# Patient Record
Sex: Female | Born: 1970
Health system: Southern US, Community
[De-identification: ages and names within clinical notes are randomized; demographics above are authoritative.]

## PROBLEM LIST (undated history)

## (undated) ENCOUNTER — Ambulatory Visit: Admission: EM | Source: Home / Self Care

## (undated) DIAGNOSIS — Z87442 Personal history of urinary calculi: Secondary | ICD-10-CM

## (undated) DIAGNOSIS — G8929 Other chronic pain: Secondary | ICD-10-CM

## (undated) DIAGNOSIS — F909 Attention-deficit hyperactivity disorder, unspecified type: Secondary | ICD-10-CM

## (undated) DIAGNOSIS — F4310 Post-traumatic stress disorder, unspecified: Secondary | ICD-10-CM

## (undated) DIAGNOSIS — R0600 Dyspnea, unspecified: Secondary | ICD-10-CM

## (undated) DIAGNOSIS — Z9989 Dependence on other enabling machines and devices: Secondary | ICD-10-CM

## (undated) DIAGNOSIS — I10 Essential (primary) hypertension: Secondary | ICD-10-CM

## (undated) DIAGNOSIS — F32A Depression, unspecified: Secondary | ICD-10-CM

## (undated) DIAGNOSIS — J45909 Unspecified asthma, uncomplicated: Secondary | ICD-10-CM

## (undated) DIAGNOSIS — B009 Herpesviral infection, unspecified: Secondary | ICD-10-CM

## (undated) DIAGNOSIS — F419 Anxiety disorder, unspecified: Secondary | ICD-10-CM

## (undated) DIAGNOSIS — R011 Cardiac murmur, unspecified: Secondary | ICD-10-CM

## (undated) DIAGNOSIS — E119 Type 2 diabetes mellitus without complications: Secondary | ICD-10-CM

## (undated) DIAGNOSIS — J449 Chronic obstructive pulmonary disease, unspecified: Secondary | ICD-10-CM

## (undated) DIAGNOSIS — D649 Anemia, unspecified: Secondary | ICD-10-CM

## (undated) HISTORY — PX: CHOLECYSTECTOMY: SHX55

## (undated) HISTORY — PX: APPENDECTOMY: SHX54

## (undated) HISTORY — DX: Attention-deficit hyperactivity disorder, unspecified type: F90.9

## (undated) HISTORY — PX: TUBAL LIGATION: SHX77

## (undated) HISTORY — PX: BACK SURGERY: SHX140

## (undated) HISTORY — PX: COLONOSCOPY: SHX174

## (undated) HISTORY — DX: Depression, unspecified: F32.A

## (undated) HISTORY — DX: Anxiety disorder, unspecified: F41.9

## (undated) HISTORY — PX: OTHER SURGICAL HISTORY: SHX169

---

## 2019-06-27 ENCOUNTER — Emergency Department (HOSPITAL_COMMUNITY): Admission: EM | Admit: 2019-06-27 | Discharge: 2019-06-27 | Discharge status: Absent without leave | Payer: Self-pay

## 2019-06-27 ENCOUNTER — Other Ambulatory Visit: Payer: Self-pay

## 2019-07-02 ENCOUNTER — Other Ambulatory Visit: Payer: Self-pay

## 2019-07-02 ENCOUNTER — Encounter (HOSPITAL_COMMUNITY): Payer: Self-pay

## 2019-07-02 ENCOUNTER — Emergency Department (HOSPITAL_COMMUNITY)
Admission: EM | Admit: 2019-07-02 | Discharge: 2019-07-02 | Disposition: A | Discharge status: Home / Self Care | Payer: Self-pay | Attending: Emergency Medicine | Admitting: Emergency Medicine

## 2019-07-02 DIAGNOSIS — M5441 Lumbago with sciatica, right side: Secondary | ICD-10-CM | POA: Insufficient documentation

## 2019-07-02 DIAGNOSIS — I10 Essential (primary) hypertension: Secondary | ICD-10-CM | POA: Insufficient documentation

## 2019-07-02 DIAGNOSIS — F17210 Nicotine dependence, cigarettes, uncomplicated: Secondary | ICD-10-CM | POA: Insufficient documentation

## 2019-07-02 DIAGNOSIS — G8929 Other chronic pain: Secondary | ICD-10-CM | POA: Insufficient documentation

## 2019-07-02 DIAGNOSIS — Z760 Encounter for issue of repeat prescription: Secondary | ICD-10-CM | POA: Insufficient documentation

## 2019-07-02 HISTORY — DX: Essential (primary) hypertension: I10

## 2019-07-02 MED ORDER — METHYLPREDNISOLONE 4 MG PO TBPK: ORAL_TABLET | ORAL | 0 refills | Status: DC

## 2019-07-02 MED ORDER — LISINOPRIL-HYDROCHLOROTHIAZIDE 20-25 MG PO TABS: 1.0000 | ORAL_TABLET | Freq: Every day | ORAL | 0 refills | Status: DC

## 2019-07-02 MED ORDER — LIDOCAINE 5 % EX PTCH: 1.0000 | MEDICATED_PATCH | CUTANEOUS | 0 refills | Status: DC

## 2019-07-02 MED ORDER — OXYCODONE-ACETAMINOPHEN 5-325 MG PO TABS
1.0000 | ORAL_TABLET | Freq: Once | ORAL | Status: AC
  Administered 2019-07-02: 1 via ORAL
  Filled 2019-07-02: qty 1

## 2019-07-02 MED ORDER — METHOCARBAMOL 500 MG PO TABS: 500.0000 mg | ORAL_TABLET | Freq: Two times a day (BID) | ORAL | 0 refills | Status: DC

## 2019-07-02 MED ORDER — LIDOCAINE 5 % EX PTCH
1.0000 | MEDICATED_PATCH | Freq: Once | CUTANEOUS | Status: DC
  Administered 2019-07-02: 13:00:00 1 via TRANSDERMAL
  Filled 2019-07-02: qty 1

## 2019-07-02 NOTE — ED Provider Notes (Signed)
[facility]-EMERGENCY DEPT Provider Note   CSN: [id number] Arrival date & time: 07/02/19  1056     History   Chief Complaint Chief Complaint  Patient presents with  . Medication Refill    HPI Kelli Juarez is a 48 y.o. female with PMHx HTN who presents to the ED today for acute on chronic right lower back pain that worsened 1 week ago.  She reports she recently moved from [facility] and believes she may have irritated her back from picking up large boxes.  States she has been taking ibuprofen without relief.  Notes that her surgeon in [place] wanted her to follow-up with pain clinic but she was unable to afford it.  She states that she moved here for her husband's job and they are in the process of finding a PCP in the area as well as a pain clinic to follow-up with.  He states that since picking up the heavy boxes she has been having radicular type pain radiating down her right lower extremity from the hip down to the ankle.  History of IV drug use.  No history of chronic steroid use.  No recent spinal manipulation.  Does report back surgery approximately 1 year ago.  Denies fever, chills, saddle anesthesia, urinary retention, urinary or bowel incontinence, weakness, numbness, any other associated symptoms.   Also requesting med refill of her 20-25 lisinopril HCTZ.  She states that she has 2 pills left.  States that she has been compliant with her medication.  Her blood pressure in the ED is 182/119.  She is endorsing this to her back pain.  Denies headache, vision changes, speech difficulties, chest pain, shortness of breath, any other associated symptoms.        Past Medical History:  Diagnosis Date  . Hypertension     There are no active problems to display for this patient.   Past Surgical History:  Procedure Laterality Date  . APPENDECTOMY    . arm surgery    . BACK SURGERY    . CESAREAN SECTION    . CHOLECYSTECTOMY    . gluteal surgery       OB  History   No obstetric history on file.      Home Medications    Prior to Admission medications   Medication Sig Start Date End Date Taking? Authorizing Provider  lidocaine (LIDODERM) 5 % Place 1 patch onto the skin daily. Remove & Discard patch within 12 hours or as directed by MD 07/02/19   Tanda Rockers, PA-C  lisinopril-hydrochlorothiazide (ZESTORETIC) 20-25 MG tablet Take 1 tablet by mouth daily. 07/02/19 08/01/19  Tanda Rockers, PA-C  methylPREDNISolone (MEDROL DOSEPAK) 4 MG TBPK tablet Follow insert on back of package 07/02/19   Tanda Rockers, PA-C    Family History History reviewed. No pertinent family history.  Social History Social History   Tobacco Use  . Smoking status: Current Every Day Smoker    Packs/day: 0.35    Types: Cigarettes  . Smokeless tobacco: Never Used  Substance Use Topics  . Alcohol use: Never    Frequency: Never  . Drug use: Never     Allergies   Toradol [ketorolac tromethamine]   Review of Systems Review of Systems  Constitutional: Negative for chills and fever.  HENT: Negative for congestion.   Eyes: Negative for visual disturbance.  Respiratory: Negative for cough and shortness of breath.   Cardiovascular: Negative for chest pain.  Gastrointestinal: Negative for abdominal pain, nausea and vomiting.  Genitourinary: Negative for difficulty urinating.  Musculoskeletal: Positive for arthralgias and back pain.  Skin: Negative for rash.  Neurological: Negative for weakness, numbness and headaches.     Physical Exam Updated Vital Signs BP (!) 182/119 (BP Location: Left Arm)   Pulse 68   Temp 98.9 F (37.2 C) (Oral)   Resp 18   Ht 5\' 1"  (1.549 m)   Wt 74.8 kg   SpO2 98%   BMI 31.18 kg/m   Physical Exam Vitals signs and nursing note reviewed.  Constitutional:      Appearance: She is not ill-appearing.  HENT:     Head: Normocephalic and atraumatic.  Eyes:     Extraocular Movements: Extraocular movements intact.      Conjunctiva/sclera: Conjunctivae normal.     Pupils: Pupils are equal, round, and reactive to light.  Neck:     Musculoskeletal: Neck supple.  Cardiovascular:     Rate and Rhythm: Normal rate and regular rhythm.     Pulses: Normal pulses.  Pulmonary:     Effort: Pulmonary effort is normal.     Breath sounds: Normal breath sounds. No wheezing, rhonchi or rales.  Abdominal:     Palpations: Abdomen is soft.     Tenderness: There is no abdominal tenderness. There is no guarding or rebound.  Musculoskeletal:     Comments: Well healed midline incision to lumbar spine. No C, T, or L midline spinal tenderness. + Right lumbar paraspinal TTP radiating into buttocks and right hip. Strength 4/5 to hip flexion, hip extension, knee flexion, knee extension, dorsiflexion, and plantarflexion on right. 5/5 on left. Sensation intact throughout to dull and sharp touch. + SLR on right. 2+DP and PT pulses.   Skin:    General: Skin is warm and dry.  Neurological:     Mental Status: She is alert.      ED Treatments / Results  Labs (all labs ordered are listed, but only abnormal results are displayed) Labs Reviewed - No data to display  EKG None  Radiology No results found.  Procedures Procedures (including critical care time)  Medications Ordered in ED Medications  lidocaine (LIDODERM) 5 % 1 patch (1 patch Transdermal Patch Applied 07/02/19 1323)  oxyCODONE-acetaminophen (PERCOCET/ROXICET) 5-325 MG per tablet 1 tablet (1 tablet Oral Given 07/02/19 1323)     Initial Impression / Assessment and Plan / ED Course  I have reviewed the triage vital signs and the nursing notes.  Pertinent labs & imaging results that were available during my care of the patient were reviewed by me and considered in my medical decision making (see chart for details).  Clinical Course as of Jul 01 1348  Tue Jul 02, 2019  1336 BP(!): 169/100 [MV]    Clinical Course User Index [MV] Eustaquio Maize, [place]    48 year old female who presents the ED requesting med refill of her 20 25 lisinopril HCTZ. Moved to the area from [place] and states she only has 2 pills left.  Is currently trying to get in the process of finding a PCP.  Pressure in the ED is 182/119.  Patient attributes this to her worsening acute on chronic back pain which was aggravated during moving large boxes and furniture last week.  She now has radicular type pain radiating down her right lower extremity which she states is new.  No red flags today concerning for cauda equina or epidural abscess.  She is afebrile in the ED without tachycardia or tachypnea.  No midline spinal tenderness.  Do not feel she needs emergent imaging at this time.    Given she states she has been compliant with her blood pressure medication they could truly be elevated due to pain.  I have given a one-time dose of Percocet in the ED as well as applied a lidocaine patch.  I had lengthy discussion with patient that I will not be prescribing narcotic pain medicine for her chronic back pain today.  Will check blood pressure after pain medication to ensure it is coming down.  I will be more than happy to write her a months worth of her blood pressure medication and give her resources for outpatient follow-up.  Feel she will benefit from steroids for her back pain.   Blood Pressure improved after pain medication.  Most recent reading 169/100.  Feel patient is stable for discharge at this time.  I have prescribed a months worth of her blood pressure medicine.  Return precautions have been discussed with patient including urinary or bowel incontinence, saddle anesthesia, urinary retention, worsening pain.  He is in agreement with plan at this time stable for discharge home.   This note was prepared using Dragon voice recognition software and may include unintentional dictation errors due to the inherent limitations of voice recognition software.       Final Clinical  Impressions(s) / ED Diagnoses   Final diagnoses:  Medication refill  Essential hypertension  Acute right-sided low back pain with right-sided sciatica    ED Discharge Orders         Ordered    lisinopril-hydrochlorothiazide (ZESTORETIC) 20-25 MG tablet  Daily     07/02/19 1346    lidocaine (LIDODERM) 5 %  Every 24 hours     07/02/19 1346    methylPREDNISolone (MEDROL DOSEPAK) 4 MG TBPK tablet     07/02/19 1346           Tanda RockersVenter, Takina Busser, PA-C 07/02/19 1349    Milagros Lollykstra, Richard S, MD 07/03/19 [phone]

## 2019-07-02 NOTE — ED Triage Notes (Signed)
Patient states she recently moved from [place] an dis now having right lower back pain that radiates into the right leg.  Patient is requesting a medication refill of Lisinopril.

## 2019-07-02 NOTE — Discharge Instructions (Addendum)
Please take medication as prescribed for your back pain I have also prescribed a months worth of your blood pressure medication  Please find a PCP to establish care - attached is a clinic that you may try to follow up with as well

## 2019-07-14 ENCOUNTER — Encounter (HOSPITAL_COMMUNITY): Payer: Self-pay | Admitting: Emergency Medicine

## 2019-07-14 ENCOUNTER — Other Ambulatory Visit: Payer: Self-pay

## 2019-07-14 ENCOUNTER — Emergency Department (HOSPITAL_COMMUNITY)
Admission: EM | Admit: 2019-07-14 | Discharge: 2019-07-14 | Disposition: A | Discharge status: Home / Self Care | Payer: Self-pay | Attending: Emergency Medicine | Admitting: Emergency Medicine

## 2019-07-14 ENCOUNTER — Emergency Department (HOSPITAL_COMMUNITY): Payer: Self-pay

## 2019-07-14 DIAGNOSIS — I10 Essential (primary) hypertension: Secondary | ICD-10-CM | POA: Insufficient documentation

## 2019-07-14 DIAGNOSIS — M25561 Pain in right knee: Secondary | ICD-10-CM | POA: Insufficient documentation

## 2019-07-14 DIAGNOSIS — M545 Low back pain, unspecified: Secondary | ICD-10-CM

## 2019-07-14 DIAGNOSIS — G8929 Other chronic pain: Secondary | ICD-10-CM | POA: Insufficient documentation

## 2019-07-14 DIAGNOSIS — F17210 Nicotine dependence, cigarettes, uncomplicated: Secondary | ICD-10-CM | POA: Insufficient documentation

## 2019-07-14 HISTORY — DX: Other chronic pain: G89.29

## 2019-07-14 MED ORDER — OXYCODONE-ACETAMINOPHEN 5-325 MG PO TABS
1.0000 | ORAL_TABLET | Freq: Once | ORAL | Status: AC
  Administered 2019-07-14: 17:00:00 1 via ORAL
  Filled 2019-07-14: qty 1

## 2019-07-14 MED ORDER — CYCLOBENZAPRINE HCL 10 MG PO TABS: 10.0000 mg | ORAL_TABLET | Freq: Two times a day (BID) | ORAL | 0 refills | Status: DC | PRN

## 2019-07-14 MED ORDER — LIDOCAINE 5 % EX PTCH: 1.0000 | MEDICATED_PATCH | CUTANEOUS | 0 refills | Status: DC

## 2019-07-14 MED ORDER — OXYCODONE HCL 5 MG PO TABS
5.0000 mg | ORAL_TABLET | Freq: Once | ORAL | Status: AC
  Administered 2019-07-14: 5 mg via ORAL
  Filled 2019-07-14: qty 1

## 2019-07-14 NOTE — ED Notes (Signed)
Patient reports she takes 10-325 Percocet's at home. RN made PA aware. Patient unsatisfied with pain management.

## 2019-07-14 NOTE — ED Provider Notes (Signed)
[facility]-EMERGENCY DEPT Provider Note   CSN: [id number] Arrival date & time: 07/14/19  1310     History   Chief Complaint Chief Complaint  Patient presents with  . Back Pain    HPI Kelli BarbanConnie Waxman is a 48 y.o. female with a past medical history of chronic back pain, hypertension presenting to the ED with a chief complaint of back pain.  Reports aching lower back pain radiating down her right leg.  Seen and evaluated on 07/02/2019 and was given steroids, muscle relaxer.  States that she has been taking these but they have not helped her.  [place] moved to [place] area [place] 2 [place] ago.  She believes that the back pain was set off by falling onto her right knee 2 months ago before she moved.  Reports she has had prior back surgeries.  She has tried Percocet in the past which helped her pain.  She denies any additional injuries or falls.  Reports not establishing care with a primary care provider or pain medicine clinic here.  Denies any fevers, shortness of breath, chest pain, vomiting, numbness in arms or legs.     HPI  Past Medical History:  Diagnosis Date  . Chronic back pain   . Hypertension     There are no active problems to display for this patient.   Past Surgical History:  Procedure Laterality Date  . APPENDECTOMY    . arm surgery    . BACK SURGERY    . CESAREAN SECTION    . CHOLECYSTECTOMY    . gluteal surgery       OB History   No obstetric history on file.      Home Medications    Prior to Admission medications   Medication Sig Start Date End Date Taking? Authorizing Provider  cyclobenzaprine (FLEXERIL) 10 MG tablet Take 1 tablet (10 mg total) by mouth 2 (two) times daily as needed for muscle spasms. 07/14/19   Asah Lamay, PA-C  lidocaine (LIDODERM) 5 % Place 1 patch onto the skin daily. Remove & Discard patch within 12 hours or as directed by MD 07/14/19   Dietrich PatesKhatri, Trianna Lupien, PA-C  lisinopril-hydrochlorothiazide (ZESTORETIC) 20-25 MG  tablet Take 1 tablet by mouth daily. 07/02/19 08/01/19  Tanda RockersVenter, Margaux, PA-C  methocarbamol (ROBAXIN) 500 MG tablet Take 1 tablet (500 mg total) by mouth 2 (two) times daily. 07/02/19   Tanda RockersVenter, Margaux, PA-C  methylPREDNISolone (MEDROL DOSEPAK) 4 MG TBPK tablet Follow insert on back of package 07/02/19   Tanda RockersVenter, Margaux, PA-C    Family History No family history on file.  Social History Social History   Tobacco Use  . Smoking status: Current Every Day Smoker    Packs/day: 0.35    Types: Cigarettes  . Smokeless tobacco: Never Used  Substance Use Topics  . Alcohol use: Never    Frequency: Never  . Drug use: Never     Allergies   Penicillins and Toradol [ketorolac tromethamine]   Review of Systems Review of Systems  Constitutional: Negative for chills and fever.  Respiratory: Negative for shortness of breath.   Cardiovascular: Negative for chest pain.  Musculoskeletal: Positive for arthralgias, back pain and myalgias.  Neurological: Negative for weakness and numbness.     Physical Exam Updated Vital Signs BP (!) 149/99 (BP Location: Right Arm)   Pulse 79   Temp 98.4 F (36.9 C)   Resp 17   SpO2 100%   Physical Exam Vitals signs and nursing note reviewed.  Constitutional:      General: She is not in acute distress.    Appearance: She is well-developed. She is not diaphoretic.  HENT:     Head: Normocephalic and atraumatic.  Eyes:     General: No scleral icterus.    Conjunctiva/sclera: Conjunctivae normal.  Neck:     Musculoskeletal: Normal range of motion.  Cardiovascular:     Rate and Rhythm: Normal rate and regular rhythm.     Heart sounds: Normal heart sounds.  Pulmonary:     Effort: Pulmonary effort is normal. No respiratory distress.     Breath sounds: Normal breath sounds.  Musculoskeletal:       Back:     Comments: No midline spinal tenderness present in lumbar, thoracic or cervical spine. No step-off palpated. No visible bruising, edema or temperature  change noted. No objective signs of numbness present. No saddle anesthesia. 2+ DP pulses bilaterally. Sensation intact to light touch. Strength 5/5 in bilateral lower extremities.  Skin:    Findings: No rash.  Neurological:     Mental Status: She is alert.      ED Treatments / Results  Labs (all labs ordered are listed, but only abnormal results are displayed) Labs Reviewed - No data to display  EKG None  Radiology Dg Lumbar Spine Complete  Result Date: 07/14/2019 CLINICAL DATA:  Low back pain. EXAM: LUMBAR SPINE - COMPLETE 4+ VIEW COMPARISON:  None. FINDINGS: Right-sided pedicle screws are seen at L4 and L5. A disc spacer device is seen at the same level. No malalignment. No fracture. IMPRESSION: Surgical hardware at L4 and L5.  No other abnormalities. Electronically Signed   By: Dorise Bullion III M.D   On: 07/14/2019 16:42   Dg Knee Complete 4 Views Right  Result Date: 07/14/2019 CLINICAL DATA:  Pain. EXAM: RIGHT KNEE - COMPLETE 4+ VIEW COMPARISON:  None. FINDINGS: No evidence of fracture, dislocation, or joint effusion. No evidence of arthropathy or other focal bone abnormality. Soft tissues are unremarkable. IMPRESSION: Negative. Electronically Signed   By: Dorise Bullion III M.D   On: 07/14/2019 16:43    Procedures Procedures (including critical care time)  Medications Ordered in ED Medications  oxyCODONE-acetaminophen (PERCOCET/ROXICET) 5-325 MG per tablet 1 tablet (1 tablet Oral Given 07/14/19 1630)  oxyCODONE (Oxy IR/ROXICODONE) immediate release tablet 5 mg (5 mg Oral Given 07/14/19 1748)     Initial Impression / Assessment and Plan / ED Course  I have reviewed the triage vital signs and the nursing notes.  Pertinent labs & imaging results that were available during my care of the patient were reviewed by me and considered in my medical decision making (see chart for details).        48 year old female presenting to the ED with a chief complaint of flareup  of chronic back pain.  This is her second visit in the past 2 weeks.  She moved from [place] 2 months ago and has not yet established care with a PCP or pain management clinic.  She is unsatisfied with her pain control at her previous visit, states that "they never even give me a prescription for lidocaine patches, the muscle relaxers do not even work I do not want to take steroids."  She states that she takes Percocet tens at home.  Patient and her husband adamant that they need some type of "strong pain control" at home.  Has been complains that he is unable to sleep at night due to the patient's pain.  Explained  after speaking with Dr. Rush Landmark that our policy prevents [place] from prescribing short-term or long-term narcotics for chronic pain.  Despite my lengthy discussion with them, they are requesting to speak to Dr. Rush Landmark in person. Dr. Rush Landmark spoke to the patient and husband and reiterated our policies.  Will discharge home with Lidoderm patch and Flexeril.  Patient encouraged to follow-up with PCP, pain management and spine specialist.  Patient is hemodynamically stable, in NAD, and able to ambulate in the ED. Evaluation does not show pathology that would require ongoing emergent intervention or inpatient treatment. I explained the diagnosis to the patient. Pain has been managed and has no complaints prior to discharge. Patient is comfortable with above plan and is stable for discharge at this time. All questions were answered prior to disposition. Strict return precautions for returning to the ED were discussed. Encouraged follow up with PCP.   An After Visit Summary was printed and given to the patient.   Portions of this note were generated with Scientist, clinical (histocompatibility and immunogenetics). Dictation errors may occur despite best attempts at proofreading.   Final Clinical Impressions(s) / ED Diagnoses   Final diagnoses:  Chronic bilateral low back pain without sciatica    ED Discharge Orders         Ordered     lidocaine (LIDODERM) 5 %  Every 24 hours     07/14/19 1725    cyclobenzaprine (FLEXERIL) 10 MG tablet  2 times daily PRN     07/14/19 1738           Dietrich Pates, PA-C 07/14/19 1813    Tegeler, Canary Brim, MD 07/14/19 1818

## 2019-07-14 NOTE — ED Triage Notes (Signed)
Per pt, states lower back pain radiating down right leg-states she has not established care with PCP since relocating

## 2019-07-14 NOTE — Discharge Instructions (Signed)
Use the lidocaine patch as prescribed. Follow up with the primary care provider listed below to establish care.  Have also provided follow-up information for spine specialist. Return to the ED if you start to develop chest pain, shortness of breath, slurred speech, injuries or falls.

## 2019-07-15 ENCOUNTER — Telehealth: Payer: Self-pay | Admitting: *Deleted

## 2019-07-15 NOTE — Telephone Encounter (Signed)
TOC CM received referral to assist with PCP and medications. Pt states she recently moved to [facility] and is currently without PCP and insurance. Appt arranged at [facility] for 08/01/2019 at 8:50 am, the flexeril is $8 at [facility] and Lidocaine patches $62 with goodrx coupon. Pt states these meds really do not help with pain. [place], [place] ED TOC CM [phone]

## 2019-08-01 ENCOUNTER — Encounter (INDEPENDENT_AMBULATORY_CARE_PROVIDER_SITE_OTHER): Payer: Self-pay | Admitting: Primary Care

## 2019-08-01 ENCOUNTER — Ambulatory Visit (INDEPENDENT_AMBULATORY_CARE_PROVIDER_SITE_OTHER): Payer: Self-pay | Admitting: Primary Care

## 2019-08-01 ENCOUNTER — Other Ambulatory Visit: Payer: Self-pay

## 2019-08-01 ENCOUNTER — Encounter (HOSPITAL_COMMUNITY): Payer: Self-pay | Admitting: Emergency Medicine

## 2019-08-01 ENCOUNTER — Emergency Department (HOSPITAL_COMMUNITY)
Admission: EM | Admit: 2019-08-01 | Discharge: 2019-08-01 | Disposition: A | Discharge status: Home / Self Care | Payer: Self-pay | Attending: Emergency Medicine | Admitting: Emergency Medicine

## 2019-08-01 VITALS — BP 132/93 | HR 83 | Temp 98.2°F | Ht 61.0 in | Wt 179.8 lb

## 2019-08-01 DIAGNOSIS — F17210 Nicotine dependence, cigarettes, uncomplicated: Secondary | ICD-10-CM | POA: Insufficient documentation

## 2019-08-01 DIAGNOSIS — G8929 Other chronic pain: Secondary | ICD-10-CM

## 2019-08-01 DIAGNOSIS — M545 Low back pain: Secondary | ICD-10-CM | POA: Insufficient documentation

## 2019-08-01 DIAGNOSIS — Z720 Tobacco use: Secondary | ICD-10-CM

## 2019-08-01 DIAGNOSIS — Z09 Encounter for follow-up examination after completed treatment for conditions other than malignant neoplasm: Secondary | ICD-10-CM

## 2019-08-01 DIAGNOSIS — Z7689 Persons encountering health services in other specified circumstances: Secondary | ICD-10-CM

## 2019-08-01 DIAGNOSIS — I10 Essential (primary) hypertension: Secondary | ICD-10-CM | POA: Insufficient documentation

## 2019-08-01 MED ORDER — LISINOPRIL-HYDROCHLOROTHIAZIDE 20-25 MG PO TABS: 1.0000 | ORAL_TABLET | Freq: Every day | ORAL | 3 refills | Status: DC

## 2019-08-01 MED ORDER — OXYCODONE-ACETAMINOPHEN 7.5-325 MG PO TABS
1.0000 | ORAL_TABLET | Freq: Once | ORAL | Status: AC
  Administered 2019-08-01: 12:00:00 1 via ORAL
  Filled 2019-08-01: qty 1

## 2019-08-01 MED ORDER — CYCLOBENZAPRINE HCL 10 MG PO TABS: 10.0000 mg | ORAL_TABLET | Freq: Two times a day (BID) | ORAL | 3 refills | Status: DC | PRN

## 2019-08-01 MED FILL — CYCLOBENZAPRINE 10 MG TAB: 10 | 30 days supply | Qty: 60 | Fill #0

## 2019-08-01 MED FILL — LISINOPRIL-HCTZ 20-25 MG TA: 20-25 | 30 days supply | Qty: 30 | Fill #0

## 2019-08-01 NOTE — Progress Notes (Signed)
New Patient Office Visit  Subjective:  Patient ID: Kelli Juarez, female    DOB: 10-16-1970  Age: 48 y.o. MRN: [id number]  CC:  Chief Complaint  Patient presents with  . Hospitalization Follow-up    chronic back pain without sciatica    HPI Kelli Juarez presents for is establishing care , she has had 2 back surgeries and is in chronic pain all the time taking excessive amounts of ibuprofen 200mg  (10) at the time. Explain damage to kidneys. She presenting to the emergency room  with a chief complaint of flareup of chronic back pain.  This is was her second visit in the past 2 weeks.  She previously moved from [place]  approxmately 2 months ago. She voiced unsatisfied with her pain control at her previous visit, states that "they never even give me a prescription for lidocaine patches, the muscle relaxers do not even work I do not want to take steroids."  Percocet is the only medication that help. Explained we do not prescribe narcotics.   Past Medical History:  Diagnosis Date  . Chronic back pain   . Hypertension     Past Surgical History:  Procedure Laterality Date  . APPENDECTOMY    . arm surgery    . BACK SURGERY    . CESAREAN SECTION    . CHOLECYSTECTOMY    . gluteal surgery      No family history on file.  Social History   Socioeconomic History  . Marital status: Married    Spouse name: Not on file  . Number of children: Not on file  . Years of education: Not on file  . Highest education level: Not on file  Occupational History  . Not on file  Social Needs  . Financial resource strain: Not on file  . Food insecurity    Worry: Not on file    Inability: Not on file  . Transportation needs    Medical: Not on file    Non-medical: Not on file  Tobacco Use  . Smoking status: Current Every Day Smoker    Packs/day: 0.35    Types: Cigarettes  . Smokeless tobacco: Never Used  Substance and Sexual Activity  . Alcohol use: Never    Frequency: Never  . Drug use:  Never  . Sexual activity: Not on file  Lifestyle  . Physical activity    Days per week: Not on file    Minutes per session: Not on file  . Stress: Not on file  Relationships  . Social Herbalist on phone: Not on file    Gets together: Not on file    Attends religious service: Not on file    Active member of club or organization: Not on file    Attends meetings of clubs or organizations: Not on file    Relationship status: Not on file  . Intimate partner violence    Fear of current or ex partner: Not on file    Emotionally abused: Not on file    Physically abused: Not on file    Forced sexual activity: Not on file  Other Topics Concern  . Not on file  Social History Narrative  . Not on file    ROS Review of Systems  Musculoskeletal: Positive for back pain.       Right side  All other systems reviewed and are negative.   Objective:   Today's Vitals: BP (!) 132/93 (BP Location: Left Arm, Patient Position: Sitting,  Cuff Size: Normal)   Pulse 83   Temp 98.2 F (36.8 C) (Oral)   Ht 5\' 1"  (1.549 m)   Wt 179 lb 12.8 oz (81.6 kg)   SpO2 94%   BMI 33.97 kg/m   Physical Exam Vitals signs reviewed.  Constitutional:      Appearance: Normal appearance.  HENT:     Right Ear: Tympanic membrane normal.     Left Ear: Tympanic membrane normal.  Eyes:     Extraocular Movements: Extraocular movements intact.     Pupils: Pupils are equal, round, and reactive to light.  Cardiovascular:     Pulses: Normal pulses.  Pulmonary:     Effort: Pulmonary effort is normal.     Breath sounds: Normal breath sounds.  Abdominal:     General: Abdomen is flat. Bowel sounds are normal.     Palpations: Abdomen is soft.  Skin:    General: Skin is warm and dry.  Neurological:     Mental Status: She is oriented to person, place, and time.  Psychiatric:     Comments: Crying thought the visit due to pain in her back     Assessment & Plan:  Bryleigh was seen today for  hospitalization follow-up.  Diagnoses and all orders for this visit:  Encounter to establish care Recently moved from [place] 2 back surgery in [place] imaging confirms with hardware. [place], NP-C will be your  (PCP) treat diagnosis and undiagnosed health concern as well as continuing care of varied medical conditions, not limited by cause, organ system, or diagnosis.   Chronic bilateral low back pain without sciatica -     cyclobenzaprine (FLEXERIL) 10 MG tablet; Take 1 tablet (10 mg total) by mouth 2 (two) times daily as needed for muscle spasms.  Essential hypertension Counseled on blood pressure goal of less than 130/80, low-sodium,  Unable to exercise due to back pain. Discussed medication compliance, adverse effects. Refilled  -     lisinopril-hydrochlorothiazide (ZESTORETIC) 20-25 MG tablet; Take 1 tablet by mouth daily.  Tobacco abuse Patient is aware of risk for increase respiratory infections and lung cancer . Each visit will discuss cessation.     Follow-up: No follow-ups on file.   Kelli Passe, NP

## 2019-08-01 NOTE — ED Provider Notes (Signed)
[facility] EMERGENCY DEPARTMENT Provider Note   CSN: [id number] Arrival date & time: 08/01/19  6546     History   Chief Complaint Chief Complaint  Patient presents with  . Back Pain    HPI Kelli Juarez is a 48 y.o. female with a history of tobacco abuse, chronic back pain, and hypertension who returns to the ER for continued back pain. Patient has history of chronic back pain with prior surgical intervention.  She is new to the area and has not established care with a primary care provider, neurosurgeon, or pain management specialist.  She has previously been prescribed Norco, oxycodone, and tramadol each of these had helped her pain but she no longer has them.  She has tried gabapentin, steroid packs, and multiple muscle relaxers without much relief.  She has also tried over-the-counter NSAIDs.  She states that she finally has money to able to go see a specialist, she is requesting referrals.  States pain is located to her lower back sometimes radiates down the right lower extremity but without numbness/tingling/weakness. No acute change in her chronic pain. NO recent trauma. Denies numbness, tingling, weakness, saddle anesthesia, incontinence to bowel/bladder, fever, chills, IV drug use, dysuria, or hx of cancer.       HPI  Past Medical History:  Diagnosis Date  . Chronic back pain   . Hypertension     There are no active problems to display for this patient.   Past Surgical History:  Procedure Laterality Date  . APPENDECTOMY    . arm surgery    . BACK SURGERY    . CESAREAN SECTION    . CHOLECYSTECTOMY    . gluteal surgery       OB History   No obstetric history on file.      Home Medications    Prior to Admission medications   Medication Sig Start Date End Date Taking? Authorizing Provider  cyclobenzaprine (FLEXERIL) 10 MG tablet Take 1 tablet (10 mg total) by mouth 2 (two) times daily as needed for muscle spasms. 08/01/19   Grayce Sessions, NP  lisinopril-hydrochlorothiazide (ZESTORETIC) 20-25 MG tablet Take 1 tablet by mouth daily. 08/01/19 08/31/19  Grayce Sessions, NP    Family History History reviewed. No pertinent family history.  Social History Social History   Tobacco Use  . Smoking status: Current Every Day Smoker    Packs/day: 0.35    Types: Cigarettes  . Smokeless tobacco: Never Used  Substance Use Topics  . Alcohol use: Never    Frequency: Never  . Drug use: Never     Allergies   Penicillins and Toradol [ketorolac tromethamine]   Review of Systems Review of Systems  Constitutional: Negative for chills and fever.  Respiratory: Negative for shortness of breath.   Cardiovascular: Negative for chest pain.  Gastrointestinal: Negative for abdominal pain.  Genitourinary: Negative for dysuria and hematuria.  Musculoskeletal: Positive for back pain.  Neurological: Negative for weakness and numbness.       Negative for incontinence or saddle anesthesia.      Physical Exam Updated Vital Signs BP 136/85 (BP Location: Right Arm)   Pulse 75   Temp 98.3 F (36.8 C) (Oral)   Resp 16   SpO2 98%   Physical Exam Constitutional:      General: She is not in acute distress.    Appearance: She is well-developed. She is not toxic-appearing.  HENT:     Head: Normocephalic and atraumatic.  Neck:  Musculoskeletal: Normal range of motion and neck supple. No spinous process tenderness or muscular tenderness.  Musculoskeletal:     Comments: No obvious deformity, appreciable swelling, erythema, ecchymosis, significant open wounds, or increased warmth.  Extremities: Normal ROM. Nontender.  Back: Patient has tenderness to the L spine region w/ R paraspinal muscle tenderness & gluteal tenderness. No point/focal vertebral tenderness, no palpable step off or crepitus.  Skin:    General: Skin is warm and dry.     Findings: No rash.  Neurological:     Mental Status: She is alert.     Deep Tendon Reflexes:      Reflex Scores:      Patellar reflexes are 2+ on the right side and 2+ on the left side.    Comments: Sensation grossly intact to bilateral lower extremities. 5/5 symmetric strength with plantar/dorsiflexion bilaterally. Antalgic gait.     ED Treatments / Results  Labs (all labs ordered are listed, but only abnormal results are displayed) Labs Reviewed - No data to display  EKG None  Radiology No results found.  Procedures Procedures (including critical care time)  Medications Ordered in ED Medications  oxyCODONE-acetaminophen (PERCOCET) 7.5-325 MG per tablet 1 tablet (1 tablet Oral Given 08/01/19 1209)     Initial Impression / Assessment and Plan / ED Course  I have reviewed the triage vital signs and the nursing notes.  Pertinent labs & imaging results that were available during my care of the patient were reviewed by me and considered in my medical decision making (see chart for details).   Patient returns to the emergency department for ongoing chronic lower back pain.  She is nontoxic-appearing, resting comfortably on initial assessment, vitals WNL.  Exam with lumbar tenderness to the midline and right lumbar paraspinal muscles extending to the gluteal area.  No point/focal vertebral tenderness.  No palpable step-off.  Patient had recent L-spine x-ray that did not show any acute abnormalities, no recent change in pain or injury to necessitate repeat imaging.  She has no neuro deficits to indicate cord compression or cauda equina syndrome.  No fever or IVDU to raise concern for epidural abscess.  Will give one-time dose of oxycodone in the emergency department.  Reeducated patient and her husband regarding chronic pain and narcotic prescriptions and lack of appropriateness in the emergency department setting.  Offered additional muscle relaxants, steroids, topical gels, or topical patches which patient declined.  She will be given information for pain management, neurosurgery, as  well as an ambulatory referral to rehab. I discussed treatment plan, need for follow-up, and return precautions with the patient. Provided opportunity for questions, patient confirmed understanding and is in agreement with plan.    Final Clinical Impressions(s) / ED Diagnoses   Final diagnoses:  Chronic low back pain, unspecified back pain laterality, unspecified whether sciatica present    ED Discharge Orders         Ordered    Ambulatory referral to Physical Medicine Rehab     08/01/19 Perry Hall, [place], PA-C 08/01/19 1211    Carmin Muskrat, MD 08/01/19 1354

## 2019-08-01 NOTE — Patient Instructions (Signed)

## 2019-08-01 NOTE — ED Triage Notes (Signed)
Pt arrives to ED from home with complaints of acute on chronic lower back pain that continues to get worse throughout this week. Patient has diagnosed pinched nerves and has had two surgeries in the past for this. Patient also has arthritis in their spine. Patient states that she has been taking cyclobenzaprine and methocarbamol at home for this. Patient states she needs for medicine for her pain control.

## 2019-08-01 NOTE — Discharge Instructions (Addendum)
You were seen in the emergency department today for back pain.  You were given oxycodone in the ER.  Please continue your muscle relaxers at home.  Please try applying a heating pad to the lower back.  We would like you to follow-up with a pain management specialist, a neuro spine specialist, as well as physical medicine rehab (similar to physical therapy).   Please call you to the specialist today to make an appointment soon as possible.  Please also call the so number circled to help establish primary care in the area.  Return to the ER for new or worsening symptoms including but not limited to numbness, weakness, loss of control of bowel/bladder function, fever, or any other concerns.

## 2019-08-06 ENCOUNTER — Encounter: Payer: Self-pay | Attending: Physical Medicine and Rehabilitation | Admitting: Physical Medicine and Rehabilitation

## 2019-08-06 ENCOUNTER — Encounter: Payer: Self-pay | Admitting: Physical Medicine and Rehabilitation

## 2019-08-06 ENCOUNTER — Other Ambulatory Visit: Payer: Self-pay

## 2019-08-06 VITALS — BP 130/91 | HR 76 | Temp 97.9°F | Ht 61.0 in | Wt 180.0 lb

## 2019-08-06 DIAGNOSIS — G8929 Other chronic pain: Secondary | ICD-10-CM | POA: Insufficient documentation

## 2019-08-06 DIAGNOSIS — M5441 Lumbago with sciatica, right side: Secondary | ICD-10-CM | POA: Insufficient documentation

## 2019-08-06 NOTE — Progress Notes (Signed)
Subjective:    Patient ID: Kelli Juarez, female    DOB: 25-Jan-1971, 48 y.o.   MRN: [id number]  HPI Kelli Juarez Is a 48 year old woman who has had prior surgeries at L4-L5 in 2014 and 2015. These were done in [place]. After her first surgery she had PT, but she did not get to follow-up with him after the second surgery because her physician switched practices. In the last 5 years she feels she has tried everything. Now her husband is working here and she will be getting insurance soon.  She has tried injections, pain medication.  She wants to be able to exercise again. She is 180 and 5 foot 1. She wants to garden. She can't stand or sit for long period of time. If she sits on the bed for long periods of time she feels a shooting pain down her right leg down to below her knee cap. She has tried gabapentin, Cymbalta, lidocaine patches, steroids and has never had relief.   Reviewed Xray of lumbar spine which shows "Surgical hardware at L4 and L5.  No other abnormalities."  Pain Inventory Average Pain 9 Pain Right Now 10 My pain is sharp, burning, dull, stabbing, tingling and aching  In the last 24 hours, has pain interfered with the following? General activity 2 Relation with others 0 Enjoyment of life 3 What TIME of day is your pain at its worst? all Sleep (in general) Poor  Pain is worse with: walking, bending, sitting, inactivity and standing Pain improves with: medication Relief from Meds: 0  Mobility walk with assistance use a cane how many minutes can you walk? 10 ability to climb steps?  no do you drive?  no Do you have any goals in this area?  yes  Function not employed: date last employed . I need assistance with the following:  household duties and shopping  Neuro/Psych weakness numbness tingling depression anxiety  Prior Studies new  Physicians involved in your care new   History reviewed. No pertinent family history. Social History    Socioeconomic History  . Marital status: Married    Spouse name: Not on file  . Number of children: Not on file  . Years of education: Not on file  . Highest education level: Not on file  Occupational History  . Not on file  Social Needs  . Financial resource strain: Not on file  . Food insecurity    Worry: Not on file    Inability: Not on file  . Transportation needs    Medical: Not on file    Non-medical: Not on file  Tobacco Use  . Smoking status: Current Every Day Smoker    Packs/day: 0.35    Types: Cigarettes  . Smokeless tobacco: Never Used  Substance and Sexual Activity  . Alcohol use: Never    Frequency: Never  . Drug use: Never  . Sexual activity: Not on file  Lifestyle  . Physical activity    Days per week: Not on file    Minutes per session: Not on file  . Stress: Not on file  Relationships  . Social Musician on phone: Not on file    Gets together: Not on file    Attends religious service: Not on file    Active member of club or organization: Not on file    Attends meetings of clubs or organizations: Not on file    Relationship status: Not on file  Other Topics  Concern  . Not on file  Social History Narrative  . Not on file   Past Surgical History:  Procedure Laterality Date  . APPENDECTOMY    . arm surgery    . BACK SURGERY    . CESAREAN SECTION    . CHOLECYSTECTOMY    . gluteal surgery     Past Medical History:  Diagnosis Date  . Chronic back pain   . Hypertension    Temp 97.9 F (36.6 C)   Ht 5\' 1"  (1.549 m)   Wt 180 lb (81.6 kg)   BMI 34.01 kg/m   Opioid Risk Score:   Fall Risk Score:  `1  Depression screen PHQ 2/9  Depression screen [facility] 2/9 08/06/2019 08/01/2019  Decreased Interest 1 1  Down, Depressed, Hopeless 1 1  PHQ - 2 Score 2 2  Altered sleeping 3 3  Tired, decreased energy 3 3  Change in appetite 3 2  Feeling bad or failure about yourself  1 1  Trouble concentrating 1 1  Moving slowly or  fidgety/restless 3 0  Suicidal thoughts 0 0  PHQ-9 Score 16 12    Review of Systems  Constitutional: Positive for unexpected weight change.  HENT: Negative.   Eyes: Negative.   Respiratory: Negative.   Cardiovascular: Negative.   Gastrointestinal: Negative.   Genitourinary: Negative.   Musculoskeletal: Positive for arthralgias, back pain, gait problem, neck pain and neck stiffness.  Skin: Negative.   Allergic/Immunologic: Negative.   Neurological: Positive for weakness and numbness.       Tingling   Psychiatric/Behavioral: Positive for dysphoric mood. The patient is nervous/anxious.   All other systems reviewed and are negative.      Objective:   Physical Exam Gen: Tearful, frustrated HEENT: oral mucosa pink and moist, NCAT Cardio: Reg rate Chest: normal effort, normal rate of breathing Abd: soft, non-distended, obese.  Ext: no edema Skin: intact Neuro/Musculoskeletal: Antalgic gait. Strength exam severely limited by pain. No focal deficits.  Psych: distressed, angry that I will not prescribe narcotics for her pain    Assessment & Plan:  Kelli Juarez is s/p prior back surgeries in 2015 and has since suffered from chronic low back pain and right sided sciatica. --I discussed with Kelli Juarez various neuropathic agents that can be used to alleviate her neuropathic pain, including Gabapentin, Cymbalta, and Amitriptyline. She would not like to try these as she has not had relief from neuropathic agents in the past. She has also stated that steroids and patches have not helped and she would like narcotics. I discussed that narcotics are not an appropriate treatment for her chronic pain and she became frustrated and requested a referral to pain management. I provided this referral. --Kelli Juarez would benefit from an MRI of her spine to assess for nerve root compression. This will help determine whether she may be a candidate for a repeat spinal surgery. She has currently  failed conservative treatment for her back pain and it is significantly impacting her quality of life.  --Return to clinic in 4 weeks to discuss results of MRI, pain management referral, and to proceed in treatment of her chronic lower back pain.  --I would ultimately like to discuss lifestyle changes with Kelli Juarez when she is amenable; at this visit she was very preoccupied by the fact that I would not prescribe narcotics for her pain.   Thirty minutes of face to face patient care time were spent during this visit. I also spoke with her  husband by phone. All questions were encouraged and answered.

## 2019-08-07 ENCOUNTER — Ambulatory Visit: Payer: Self-pay | Admitting: Family Medicine

## 2019-08-12 ENCOUNTER — Telehealth: Payer: Self-pay

## 2019-08-12 DIAGNOSIS — G8929 Other chronic pain: Secondary | ICD-10-CM

## 2019-08-12 NOTE — Telephone Encounter (Signed)
Patient called requesting referral to [facility]. She states the pain clinic originally referred to is not close to her house.

## 2019-08-13 ENCOUNTER — Other Ambulatory Visit: Payer: Self-pay | Admitting: Physical Medicine and Rehabilitation

## 2019-08-13 NOTE — Telephone Encounter (Signed)
Mrs Kelli Juarez has called back and is asking again about a referral to [facility] because it is closer to her.

## 2019-08-30 MED FILL — LISINOPRIL-HCTZ 20-25 MG TA: 20-25 | 30 days supply | Qty: 30 | Fill #1

## 2019-08-30 MED FILL — CYCLOBENZAPRINE 10 MG TAB: 10 | 30 days supply | Qty: 60 | Fill #1

## 2019-09-03 ENCOUNTER — Encounter: Payer: Self-pay | Attending: Physical Medicine and Rehabilitation | Admitting: Physical Medicine and Rehabilitation

## 2019-09-03 DIAGNOSIS — G8929 Other chronic pain: Secondary | ICD-10-CM | POA: Insufficient documentation

## 2019-09-03 DIAGNOSIS — M5441 Lumbago with sciatica, right side: Secondary | ICD-10-CM | POA: Insufficient documentation

## 2019-10-02 MED FILL — LISINOPRIL-HCTZ 20-25 MG TA: 20-25 | 30 days supply | Qty: 30 | Fill #2

## 2019-11-01 ENCOUNTER — Ambulatory Visit (INDEPENDENT_AMBULATORY_CARE_PROVIDER_SITE_OTHER): Payer: Self-pay | Admitting: Primary Care

## 2019-11-04 MED FILL — CYCLOBENZAPRINE 10 MG TAB: 10 | 30 days supply | Qty: 60 | Fill #2

## 2019-11-04 MED FILL — LISINOPRIL-HCTZ 20-25 MG TA: 20-25 | 30 days supply | Qty: 30 | Fill #3

## 2019-12-10 DIAGNOSIS — I10 Essential (primary) hypertension: Secondary | ICD-10-CM | POA: Insufficient documentation

## 2019-12-10 DIAGNOSIS — F419 Anxiety disorder, unspecified: Secondary | ICD-10-CM | POA: Insufficient documentation

## 2019-12-10 DIAGNOSIS — G894 Chronic pain syndrome: Secondary | ICD-10-CM | POA: Insufficient documentation

## 2019-12-10 DIAGNOSIS — F4310 Post-traumatic stress disorder, unspecified: Secondary | ICD-10-CM | POA: Insufficient documentation

## 2019-12-10 DIAGNOSIS — G8921 Chronic pain due to trauma: Secondary | ICD-10-CM | POA: Insufficient documentation

## 2019-12-10 DIAGNOSIS — F3341 Major depressive disorder, recurrent, in partial remission: Secondary | ICD-10-CM | POA: Insufficient documentation

## 2019-12-16 DIAGNOSIS — R87613 High grade squamous intraepithelial lesion on cytologic smear of cervix (HGSIL): Secondary | ICD-10-CM | POA: Insufficient documentation

## 2019-12-21 DIAGNOSIS — R011 Cardiac murmur, unspecified: Secondary | ICD-10-CM | POA: Insufficient documentation

## 2020-04-08 ENCOUNTER — Emergency Department (HOSPITAL_COMMUNITY)
Admission: EM | Admit: 2020-04-08 | Discharge: 2020-04-08 | Disposition: A | Discharge status: Home / Self Care | Payer: BC Managed Care – PPO | Attending: Emergency Medicine | Admitting: Emergency Medicine

## 2020-04-08 ENCOUNTER — Other Ambulatory Visit: Payer: Self-pay

## 2020-04-08 ENCOUNTER — Emergency Department (HOSPITAL_COMMUNITY): Payer: BC Managed Care – PPO

## 2020-04-08 DIAGNOSIS — Z5321 Procedure and treatment not carried out due to patient leaving prior to being seen by health care provider: Secondary | ICD-10-CM | POA: Diagnosis not present

## 2020-04-08 DIAGNOSIS — R55 Syncope and collapse: Secondary | ICD-10-CM | POA: Diagnosis not present

## 2020-04-08 DIAGNOSIS — R42 Dizziness and giddiness: Secondary | ICD-10-CM | POA: Insufficient documentation

## 2020-04-08 NOTE — ED Triage Notes (Signed)
Patient reported she fell forward cause she tripped over her kitten,.Stated she fell on her chin and passed out. Patient stated she is also dizzy. Stated she had 2 back surgery and is on pain medication management

## 2020-04-09 DIAGNOSIS — Z79891 Long term (current) use of opiate analgesic: Secondary | ICD-10-CM | POA: Insufficient documentation

## 2020-07-23 DIAGNOSIS — R768 Other specified abnormal immunological findings in serum: Secondary | ICD-10-CM | POA: Insufficient documentation

## 2020-07-27 DIAGNOSIS — D069 Carcinoma in situ of cervix, unspecified: Secondary | ICD-10-CM | POA: Insufficient documentation

## 2020-08-26 DIAGNOSIS — Z62810 Personal history of physical and sexual abuse in childhood: Secondary | ICD-10-CM | POA: Insufficient documentation

## 2020-09-02 ENCOUNTER — Ambulatory Visit (INDEPENDENT_AMBULATORY_CARE_PROVIDER_SITE_OTHER): Payer: BC Managed Care – PPO

## 2020-09-02 ENCOUNTER — Other Ambulatory Visit: Payer: Self-pay

## 2020-09-02 ENCOUNTER — Ambulatory Visit
Admission: EM | Admit: 2020-09-02 | Discharge: 2020-09-02 | Disposition: A | Discharge status: Home / Self Care | Payer: BC Managed Care – PPO | Attending: Emergency Medicine | Admitting: Emergency Medicine

## 2020-09-02 DIAGNOSIS — W5501XA Bitten by cat, initial encounter: Secondary | ICD-10-CM

## 2020-09-02 DIAGNOSIS — L989 Disorder of the skin and subcutaneous tissue, unspecified: Secondary | ICD-10-CM | POA: Diagnosis not present

## 2020-09-02 DIAGNOSIS — M79642 Pain in left hand: Secondary | ICD-10-CM | POA: Diagnosis not present

## 2020-09-02 DIAGNOSIS — S61253A Open bite of left middle finger without damage to nail, initial encounter: Secondary | ICD-10-CM | POA: Diagnosis not present

## 2020-09-02 MED ORDER — DOXYCYCLINE HYCLATE 100 MG PO CAPS: 100.0000 mg | ORAL_CAPSULE | Freq: Two times a day (BID) | ORAL | 0 refills | Status: AC

## 2020-09-02 NOTE — ED Triage Notes (Signed)
Patient states she hit her cat with her left hand and now her left middle finger and adjacent area is swelling and throbbing. Pt also states she cannot close her had but then stated she can but it is too painful. Pt is aox4 and ambulatory.

## 2020-09-02 NOTE — Discharge Instructions (Addendum)
RICE: rest, ice, compression, elevation as needed for pain.    Pain medication:  500 mg Naprosyn/Aleve (naproxen) every 12 hours with food:  AVOID other NSAIDs while taking this (may have Tylenol).  Important to follow up with specialist(s) below for further evaluation/management if your symptoms persist or worsen. 

## 2020-09-02 NOTE — ED Provider Notes (Signed)
[facility]    CSN: [id number] Arrival date & time: 09/02/20  1658      History   Chief Complaint Chief Complaint  Patient presents with  . Hand Pain    injured this evening    HPI Kelli Juarez is a 49 y.o. female  Presenting for left hand pain, swelling, painful ROM.  States she "bopped" her cat after it hissed at her and it's tooth got caught in her finger.  UTD on shots.    Past Medical History:  Diagnosis Date  . Chronic back pain   . Hypertension     There are no problems to display for this patient.   Past Surgical History:  Procedure Laterality Date  . APPENDECTOMY    . arm surgery    . BACK SURGERY    . CESAREAN SECTION    . CHOLECYSTECTOMY    . gluteal surgery      OB History   No obstetric history on file.      Home Medications    Prior to Admission medications   Medication Sig Start Date End Date Taking? Authorizing Provider  cyclobenzaprine (FLEXERIL) 10 MG tablet Take 1 tablet (10 mg total) by mouth 2 (two) times daily as needed for muscle spasms. 08/01/19   Grayce Sessions, NP  doxycycline (VIBRAMYCIN) 100 MG capsule Take 1 capsule (100 mg total) by mouth 2 (two) times daily for 7 days. 09/02/20 09/09/20  Hall-Potvin, [place], PA-C  lisinopril-hydrochlorothiazide (ZESTORETIC) 20-25 MG tablet Take 1 tablet by mouth daily. 08/01/19 08/31/19  Grayce Sessions, NP    Family History History reviewed. No pertinent family history.  Social History Social History   Tobacco Use  . Smoking status: Current Every Day Smoker    Packs/day: 0.35    Types: Cigarettes  . Smokeless tobacco: Never Used  Vaping Use  . Vaping Use: Never used  Substance Use Topics  . Alcohol use: Never  . Drug use: Never     Allergies   Penicillins and Toradol [ketorolac tromethamine]   Review of Systems Review of Systems  Constitutional: Negative for fatigue and fever.  HENT: Negative for ear pain, sinus pain, sore throat and voice change.    Eyes: Negative for pain, redness and visual disturbance.  Respiratory: Negative for cough and shortness of breath.   Cardiovascular: Negative for chest pain and palpitations.  Gastrointestinal: Negative for abdominal pain, diarrhea and vomiting.  Musculoskeletal: Negative for arthralgias and myalgias.       Positive for left hand pain and wound  Skin: Negative for rash and wound.  Neurological: Negative for syncope and headaches.     Physical Exam Triage Vital Signs ED Triage Vitals [09/02/20 1808]  Enc Vitals Group     BP 133/86     Pulse Rate 88     Resp 20     Temp 98.4 F (36.9 C)     Temp Source Oral     SpO2 98 %     Weight      Height      Head Circumference      Peak Flow      Pain Score      Pain Loc      Pain Edu?      Excl. in GC?    No data found.  Updated Vital Signs BP 133/86 (BP Location: Right Arm)   Pulse 88   Temp 98.4 F (36.9 C) (Oral)   Resp 20   SpO2 98%  Visual Acuity Right Eye Distance:   Left Eye Distance:   Bilateral Distance:    Right Eye Near:   Left Eye Near:    Bilateral Near:     Physical Exam Constitutional:      General: She is not in acute distress. HENT:     Head: Normocephalic and atraumatic.  Eyes:     General: No scleral icterus.    Pupils: Pupils are equal, round, and reactive to light.  Cardiovascular:     Rate and Rhythm: Normal rate.  Pulmonary:     Effort: Pulmonary effort is normal.  Musculoskeletal:        General: Swelling, tenderness and signs of injury present.     Comments: L hand w/ diffuse pain.  Small punctate wound to 3rd digit w/o bleeding or d/c.  NVI  Skin:    Capillary Refill: Capillary refill takes less than 2 seconds.     Coloration: Skin is not jaundiced or pale.     Findings: No bruising.  Neurological:     General: No focal deficit present.     Mental Status: She is alert and oriented to person, place, and time.      UC Treatments / Results  Labs (all labs ordered are listed,  but only abnormal results are displayed) Labs Reviewed - No data to display  EKG   Radiology DG Hand Complete Left  Result Date: 09/02/2020 CLINICAL DATA:  Cat bite middle finger EXAM: LEFT HAND - COMPLETE 3+ VIEW COMPARISON:  None. FINDINGS: There is no evidence of fracture or dislocation. There is no evidence of arthropathy or other focal bone abnormality. No radiopaque foreign body in the soft tissues. IMPRESSION: Negative. Electronically Signed   By: Jasmine Pang M.D.   On: 09/02/2020 19:18    Procedures Procedures (including critical care time)  Medications Ordered in UC Medications - No data to display  Initial Impression / Assessment and Plan / UC Course  I have reviewed the triage vital signs and the nursing notes.  Pertinent labs & imaging results that were available during my care of the patient were reviewed by me and considered in my medical decision making (see chart for details).     XR negative.  Reviewed supportive care as below, start doxycycline to cover for infection s/p cat bite (previous adverse reaction to penicillins).  Return precautions discussed, pt verbalized understanding and is agreeable to plan. Final Clinical Impressions(s) / UC Diagnoses   Final diagnoses:  Cat bite, initial encounter  Hand pain, left  Facial lesion     Discharge Instructions     RICE: rest, ice, compression, elevation as needed for pain.    Pain medication:  500 mg Naprosyn/Aleve (naproxen) every 12 hours with food:  AVOID other NSAIDs while taking this (may have Tylenol).  Important to follow up with specialist(s) below for further evaluation/management if your symptoms persist or worsen.    ED Prescriptions    Medication Sig Dispense Auth. Provider   doxycycline (VIBRAMYCIN) 100 MG capsule Take 1 capsule (100 mg total) by mouth 2 (two) times daily for 7 days. 14 capsule Hall-Potvin, [place], PA-C     PDMP not reviewed this encounter.   Odette Fraction [place],  [place] 09/02/20 1931

## 2020-11-08 ENCOUNTER — Ambulatory Visit (INDEPENDENT_AMBULATORY_CARE_PROVIDER_SITE_OTHER): Payer: BC Managed Care – PPO

## 2020-11-08 ENCOUNTER — Other Ambulatory Visit: Payer: Self-pay

## 2020-11-08 ENCOUNTER — Ambulatory Visit
Admission: EM | Admit: 2020-11-08 | Discharge: 2020-11-08 | Disposition: A | Discharge status: Home / Self Care | Payer: BC Managed Care – PPO

## 2020-11-08 ENCOUNTER — Telehealth: Payer: Self-pay | Admitting: Family Medicine

## 2020-11-08 DIAGNOSIS — M545 Low back pain, unspecified: Secondary | ICD-10-CM

## 2020-11-08 DIAGNOSIS — M5412 Radiculopathy, cervical region: Secondary | ICD-10-CM | POA: Diagnosis not present

## 2020-11-08 DIAGNOSIS — M47812 Spondylosis without myelopathy or radiculopathy, cervical region: Secondary | ICD-10-CM | POA: Diagnosis not present

## 2020-11-08 DIAGNOSIS — M542 Cervicalgia: Secondary | ICD-10-CM | POA: Diagnosis not present

## 2020-11-08 DIAGNOSIS — R3129 Other microscopic hematuria: Secondary | ICD-10-CM | POA: Diagnosis not present

## 2020-11-08 DIAGNOSIS — G8929 Other chronic pain: Secondary | ICD-10-CM

## 2020-11-08 DIAGNOSIS — I10 Essential (primary) hypertension: Secondary | ICD-10-CM

## 2020-11-08 LAB — POCT URINALYSIS DIP (MANUAL ENTRY)
Bilirubin, UA: NEGATIVE
Glucose, UA: NEGATIVE mg/dL
Ketones, POC UA: NEGATIVE mg/dL
Leukocytes, UA: NEGATIVE
Nitrite, UA: NEGATIVE
Protein Ur, POC: NEGATIVE mg/dL
Spec Grav, UA: 1.02 (ref 1.010–1.025)
Urobilinogen, UA: 0.2 E.U./dL
pH, UA: 7 (ref 5.0–8.0)

## 2020-11-08 MED ORDER — PREDNISONE 10 MG (21) PO TBPK: ORAL_TABLET | Freq: Every day | ORAL | 0 refills | Status: DC

## 2020-11-08 MED ORDER — CYCLOBENZAPRINE HCL 10 MG PO TABS: 10.0000 mg | ORAL_TABLET | Freq: Two times a day (BID) | ORAL | 0 refills | Status: DC | PRN

## 2020-11-08 NOTE — ED Provider Notes (Signed)
[facility]    CSN: [id number] Arrival date & time: 11/08/20  0841      History   Chief Complaint Chief Complaint  Patient presents with  . Neck Pain    X 1 week  . Back Pain    X 1 week    HPI Kelli Juarez is a 50 y.o. female.   Presenting today with acute on chronic lumbar region pain and new posterior neck pain and right sided neck and upper back muscle soreness. Having feelings of cool water running down her back associated with these pains on the right side. Pain worse with laying flat on the bed so sleeping in a recliner. Denies UE weakness, numbness tingling, new injuries. Has had multiple lumbar surgeries in the past and follows with pain mgmt for her pain regimen with percocet. She states these take the edge off of her pain but do not resolve it. Has not taken anything aside from this for current sxs.      Past Medical History:  Diagnosis Date  . Chronic back pain   . Hypertension     There are no problems to display for this patient.   Past Surgical History:  Procedure Laterality Date  . APPENDECTOMY    . arm surgery    . BACK SURGERY    . CESAREAN SECTION    . CHOLECYSTECTOMY    . gluteal surgery      OB History   No obstetric history on file.      Home Medications    Prior to Admission medications   Medication Sig Start Date End Date Taking? Authorizing Provider  busPIRone (BUSPAR) 15 MG tablet Take 15 mg by mouth 3 (three) times daily.   Yes [provider]  FLUoxetine (PROZAC) 20 MG capsule Take 80 mg by mouth daily.   Yes [provider]  furosemide (LASIX) 20 MG tablet Take 40 mg by mouth.   Yes [provider]  lisinopril-hydrochlorothiazide (ZESTORETIC) 20-25 MG tablet Take 1 tablet by mouth daily. 08/01/19 08/31/19 Yes Grayce Sessions, NP  oxyCODONE-acetaminophen (PERCOCET) 10-325 MG tablet Take 1 tablet by mouth every 4 (four) hours as needed for pain.   Yes [provider]  predniSONE  (STERAPRED UNI-PAK 21 TAB) 10 MG (21) TBPK tablet Take by mouth daily. Take 6 tabs day one, 5 tabs day two, 4 tabs day three, etc 11/08/20  Yes Particia Nearing, PA-C  cyclobenzaprine (FLEXERIL) 10 MG tablet Take 1 tablet (10 mg total) by mouth 2 (two) times daily as needed for muscle spasms. DO NOT DRINK ALCOHOL OR DRIVE WHILE TAKING THIS MEDICATION - MAY CAUSE DROWSINESS 11/08/20   Particia Nearing, PA-C    Family History History reviewed. No pertinent family history.  Social History Social History   Tobacco Use  . Smoking status: Current Every Day Smoker    Packs/day: 0.35    Types: Cigarettes  . Smokeless tobacco: Never Used  Vaping Use  . Vaping Use: Never used  Substance Use Topics  . Alcohol use: Never  . Drug use: Never     Allergies   Penicillins and Toradol [ketorolac tromethamine]   Review of Systems Review of Systems   Physical Exam Triage Vital Signs ED Triage Vitals  Enc Vitals Group     BP 11/08/20 0905 (!) 186/109     Pulse Rate 11/08/20 0905 80     Resp 11/08/20 0905 20     Temp 11/08/20 0905 98.1 F (36.7 C)  Temp Source 11/08/20 0905 Oral     SpO2 11/08/20 0905 98 %     Weight --      Height --      Head Circumference --      Peak Flow --      Pain Score 11/08/20 0911 7     Pain Loc --      Pain Edu? --      Excl. in GC? --    No data found.  Updated Vital Signs BP (!) 186/109 (BP Location: Right Arm)   Pulse 80   Temp 98.1 F (36.7 C) (Oral)   Resp 20   SpO2 98%   Visual Acuity Right Eye Distance:   Left Eye Distance:   Bilateral Distance:    Right Eye Near:   Left Eye Near:    Bilateral Near:     Physical Exam Vitals and nursing note reviewed.  Constitutional:      Appearance: Normal appearance. She is not ill-appearing.  HENT:     Head: Atraumatic.  Eyes:     Extraocular Movements: Extraocular movements intact.     Conjunctiva/sclera: Conjunctivae normal.  Cardiovascular:     Rate and Rhythm: Normal  rate and regular rhythm.     Pulses: Normal pulses.     Heart sounds: Normal heart sounds.  Pulmonary:     Effort: Pulmonary effort is normal.     Breath sounds: Normal breath sounds.  Abdominal:     General: Bowel sounds are normal. There is no distension.     Palpations: Abdomen is soft.     Tenderness: There is no abdominal tenderness. There is no right CVA tenderness, left CVA tenderness or guarding.  Musculoskeletal:        General: Tenderness present. No swelling, deformity or signs of injury. Normal range of motion.     Cervical back: Normal range of motion and neck supple.     Comments: Lower c spine ttp at midline, right latissimus and paraspinal muscles ttp  Skin:    General: Skin is warm and dry.  Neurological:     Mental Status: She is alert and oriented to person, place, and time.     Sensory: No sensory deficit.     Motor: No weakness.     Gait: Gait abnormal (antalgic gait, baseline).  Psychiatric:        Mood and Affect: Mood normal.        Thought Content: Thought content normal.        Judgment: Judgment normal.     UC Treatments / Results  Labs (all labs ordered are listed, but only abnormal results are displayed) Labs Reviewed  POCT URINALYSIS DIP (MANUAL ENTRY) - Abnormal; Notable for the following components:      Result Value   Blood, UA trace-intact (*)    All other components within normal limits    EKG   Radiology DG Cervical Spine Complete  Result Date: 11/08/2020 CLINICAL DATA:  Neck pain for the past week. EXAM: CERVICAL SPINE - COMPLETE 4+ VIEW COMPARISON:  None. FINDINGS: The lateral view is diagnostic to the C7 level. There is no acute fracture or subluxation. Vertebral body heights are preserved. Alignment is normal. Moderate C5-C6 and mild C6-C7 disc height loss. Moderate left and mild right neuroforaminal stenosis at C5-C6 and C6-C7 due to uncovertebral hypertrophy. Normal prevertebral soft tissues. IMPRESSION: 1. Moderate C5-C6 and mild  C6-C7 spondylosis with moderate left and mild right neuroforaminal stenosis at these levels. Electronically  Signed   By: Obie Dredge M.D.   On: 11/08/2020 10:24   DG Thoracic Spine 2 View  Result Date: 11/08/2020 CLINICAL DATA:  Neck pain radiating into the upper back for the past week. EXAM: THORACIC SPINE 2 VIEWS COMPARISON:  None. FINDINGS: Twelve rib-bearing thoracic vertebral bodies. No acute fracture or subluxation. Vertebral body heights are preserved. Alignment is normal. Intervertebral disc spaces are maintained. IMPRESSION: Negative. Electronically Signed   By: Obie Dredge M.D.   On: 11/08/2020 10:25    Procedures Procedures (including critical care time)  Medications Ordered in UC Medications - No data to display  Initial Impression / Assessment and Plan / UC Course  I have reviewed the triage vital signs and the nursing notes.  Pertinent labs & imaging results that were available during my care of the patient were reviewed by me and considered in my medical decision making (see chart for details).     Thoracic spine film negative, c spine x-ray showing C 5-6 spondylosis with foraminal narrowing per report. Discussed these likely chronic changes with patient and that given her radicular sxs she should f/u with Neurosurgery for further eval and imaging if needed. In meantime, prednisone, flexeril, supportive home care, continued pain mgmt regimen. Follow up with Pain Specialist on acute on chronic low back pain and PCP for trace hematuria to recheck. Discussed possible causes of this.   Also reviewed significantly elevated BP - possibly secondary to pain and anxiety regarding her pain but f/u with PCP in next few days for this. Return for acutely worsening sxs.   Final Clinical Impressions(s) / UC Diagnoses   Final diagnoses:  Cervical neuropathic pain  Cervical spondylosis without myelopathy  Chronic midline low back pain without sciatica  Microscopic hematuria   Essential hypertension   Discharge Instructions   None    ED Prescriptions    Medication Sig Dispense Auth. Provider   cyclobenzaprine (FLEXERIL) 10 MG tablet Take 1 tablet (10 mg total) by mouth 2 (two) times daily as needed for muscle spasms. DO NOT DRINK ALCOHOL OR DRIVE WHILE TAKING THIS MEDICATION - MAY CAUSE DROWSINESS 30 tablet Particia Nearing, PA-C   predniSONE (STERAPRED UNI-PAK 21 TAB) 10 MG (21) TBPK tablet Take by mouth daily. Take 6 tabs day one, 5 tabs day two, 4 tabs day three, etc 21 tablet Particia Nearing, [place]     PDMP not reviewed this encounter.   Particia Nearing, [place] 11/08/20 1117

## 2020-11-08 NOTE — Telephone Encounter (Signed)
Rx's sent to different pharmacy per patient request

## 2020-11-08 NOTE — ED Triage Notes (Signed)
Patient states she is having a reoccurrence of her back pain and it feels differently. Pt also states she has neck pain that feels like ice down her back. Pt is aox4 and ambulatory. Pt has a cage and screws in her back and is being seen by pain management.

## 2021-09-09 ENCOUNTER — Other Ambulatory Visit: Payer: Self-pay | Admitting: Physical Medicine and Rehabilitation

## 2021-09-09 DIAGNOSIS — M542 Cervicalgia: Secondary | ICD-10-CM

## 2021-09-24 ENCOUNTER — Ambulatory Visit
Admission: RE | Admit: 2021-09-24 | Discharge: 2021-09-24 | Disposition: A | Discharge status: Home / Self Care | Payer: BC Managed Care – PPO | Source: Ambulatory Visit | Attending: Physical Medicine and Rehabilitation | Admitting: Physical Medicine and Rehabilitation

## 2021-09-24 ENCOUNTER — Other Ambulatory Visit: Payer: Self-pay

## 2021-09-24 DIAGNOSIS — G8929 Other chronic pain: Secondary | ICD-10-CM

## 2021-09-29 ENCOUNTER — Other Ambulatory Visit: Payer: BC Managed Care – PPO

## 2022-01-11 ENCOUNTER — Institutional Professional Consult (permissible substitution): Payer: BC Managed Care – PPO | Admitting: Pulmonary Disease

## 2022-01-11 NOTE — Progress Notes (Deleted)
Synopsis: Referred in April 2023 for wheezing by Kelli Forester, PA  Subjective:   PATIENT ID: Kelli Juarez: female DOB: [birth year], MRN: [id number]   HPI  No chief complaint on file.  Kelli Juarez is a 51 year old woman, daily smoker with history of hypertension who is referred to pulmonary clinic for shortness of breath and wheezing.   She was evaluated 11/18/21 by her primary care team where she reported dyspnea and wheezing on exam were noted. There was concern for COPD. She was prescribed albuterol inhaler.   Past Medical History:  Diagnosis Date   Chronic back pain    Hypertension      No family history on file.   Social History   Socioeconomic History   Marital status: Married    Spouse name: Not on file   Number of children: Not on file   Years of education: Not on file   Highest education level: Not on file  Occupational History   Not on file  Tobacco Use   Smoking status: Every Day    Packs/day: 0.35    Types: Cigarettes   Smokeless tobacco: Never  Vaping Use   Vaping Use: Never used  Substance and Sexual Activity   Alcohol use: Never   Drug use: Never   Sexual activity: Yes  Other Topics Concern   Not on file  Social History Narrative   Not on file   Social Determinants of Health   Financial Resource Strain: Not on file  Food Insecurity: Not on file  Transportation Needs: Not on file  Physical Activity: Not on file  Stress: Not on file  Social Connections: Not on file  Intimate Partner Violence: Not on file     Allergies  Allergen Reactions   Penicillins     Reports throat closes up   Toradol [Ketorolac Tromethamine]      Outpatient Medications Prior to Visit  Medication Sig Dispense Refill   busPIRone (BUSPAR) 15 MG tablet Take 15 mg by mouth 3 (three) times daily.     cyclobenzaprine (FLEXERIL) 10 MG tablet Take 1 tablet (10 mg total) by mouth 2 (two) times daily as needed for muscle spasms. DO NOT DRINK ALCOHOL OR DRIVE  WHILE TAKING THIS MEDICATION - MAY CAUSE DROWSINESS 30 tablet 0   FLUoxetine (PROZAC) 20 MG capsule Take 80 mg by mouth daily.     furosemide (LASIX) 20 MG tablet Take 40 mg by mouth.     lisinopril-hydrochlorothiazide (ZESTORETIC) 20-25 MG tablet Take 1 tablet by mouth daily. 30 tablet 3   oxyCODONE-acetaminophen (PERCOCET) 10-325 MG tablet Take 1 tablet by mouth every 4 (four) hours as needed for pain.     predniSONE (STERAPRED UNI-PAK 21 TAB) 10 MG (21) TBPK tablet Take by mouth daily. Take 6 tabs day one, 5 tabs day two, 4 tabs day three, etc 21 tablet 0   No facility-administered medications prior to visit.   Review of Systems  Constitutional:  Negative for chills, fever, malaise/fatigue and weight loss.  HENT:  Negative for congestion, sinus pain and sore throat.   Eyes: Negative.   Respiratory:  Negative for cough, hemoptysis, sputum production, shortness of breath and wheezing.   Cardiovascular:  Negative for chest pain, palpitations, orthopnea, claudication and leg swelling.  Gastrointestinal:  Negative for abdominal pain, heartburn, nausea and vomiting.  Genitourinary: Negative.   Musculoskeletal:  Negative for joint pain and myalgias.  Skin:  Negative for rash.  Neurological:  Negative for weakness.  Endo/Heme/Allergies: Negative.  Psychiatric/Behavioral: Negative.     Objective:  There were no vitals filed for this visit.  Physical Exam Constitutional:      General: She is not in acute distress.    Appearance: She is not ill-appearing.  HENT:     Head: Normocephalic and atraumatic.  Eyes:     General: No scleral icterus.    Conjunctiva/sclera: Conjunctivae normal.     Pupils: Pupils are equal, round, and reactive to light.  Cardiovascular:     Rate and Rhythm: Normal rate and regular rhythm.     Pulses: Normal pulses.     Heart sounds: Normal heart sounds. No murmur heard. Pulmonary:     Effort: Pulmonary effort is normal.     Breath sounds: Normal breath  sounds. No wheezing, rhonchi or rales.  Abdominal:     General: Bowel sounds are normal.     Palpations: Abdomen is soft.  Musculoskeletal:     Right lower leg: No edema.     Left lower leg: No edema.  Lymphadenopathy:     Cervical: No cervical adenopathy.  Skin:    General: Skin is warm and dry.  Neurological:     General: No focal deficit present.     Mental Status: She is alert.  Psychiatric:        Mood and Affect: Mood normal.        Behavior: Behavior normal.        Thought Content: Thought content normal.        Judgment: Judgment normal.   CBC No results found for: WBC, RBC, HGB, HCT, PLT, MCV, MCH, MCHC, RDW, LYMPHSABS, MONOABS, EOSABS, BASOSABS   Chest imaging:  PFT:     View : No data to display.          Labs: CBC 09/2021 - unremarkable CMP 09/2021 - elevated albumin Path:  Echo:  Heart Catheterization:  Assessment & Plan:   No diagnosis found.  Discussion: ***    Current Outpatient Medications:    busPIRone (BUSPAR) 15 MG tablet, Take 15 mg by mouth 3 (three) times daily., Disp: , Rfl:    cyclobenzaprine (FLEXERIL) 10 MG tablet, Take 1 tablet (10 mg total) by mouth 2 (two) times daily as needed for muscle spasms. DO NOT DRINK ALCOHOL OR DRIVE WHILE TAKING THIS MEDICATION - MAY CAUSE DROWSINESS, Disp: 30 tablet, Rfl: 0   FLUoxetine (PROZAC) 20 MG capsule, Take 80 mg by mouth daily., Disp: , Rfl:    furosemide (LASIX) 20 MG tablet, Take 40 mg by mouth., Disp: , Rfl:    lisinopril-hydrochlorothiazide (ZESTORETIC) 20-25 MG tablet, Take 1 tablet by mouth daily., Disp: 30 tablet, Rfl: 3   oxyCODONE-acetaminophen (PERCOCET) 10-325 MG tablet, Take 1 tablet by mouth every 4 (four) hours as needed for pain., Disp: , Rfl:    predniSONE (STERAPRED UNI-PAK 21 TAB) 10 MG (21) TBPK tablet, Take by mouth daily. Take 6 tabs day one, 5 tabs day two, 4 tabs day three, etc, Disp: 21 tablet, Rfl: 0

## 2022-01-25 ENCOUNTER — Emergency Department (HOSPITAL_COMMUNITY)
Admission: EM | Admit: 2022-01-25 | Discharge: 2022-01-25 | Disposition: A | Discharge status: Home / Self Care | Payer: BC Managed Care – PPO | Attending: Emergency Medicine | Admitting: Emergency Medicine

## 2022-01-25 ENCOUNTER — Emergency Department (HOSPITAL_COMMUNITY): Payer: BC Managed Care – PPO

## 2022-01-25 ENCOUNTER — Other Ambulatory Visit: Payer: Self-pay

## 2022-01-25 ENCOUNTER — Encounter (HOSPITAL_COMMUNITY): Payer: Self-pay | Admitting: Emergency Medicine

## 2022-01-25 DIAGNOSIS — R600 Localized edema: Secondary | ICD-10-CM | POA: Diagnosis not present

## 2022-01-25 DIAGNOSIS — R1013 Epigastric pain: Secondary | ICD-10-CM | POA: Diagnosis not present

## 2022-01-25 DIAGNOSIS — R7989 Other specified abnormal findings of blood chemistry: Secondary | ICD-10-CM | POA: Insufficient documentation

## 2022-01-25 DIAGNOSIS — I10 Essential (primary) hypertension: Secondary | ICD-10-CM | POA: Insufficient documentation

## 2022-01-25 DIAGNOSIS — Z79899 Other long term (current) drug therapy: Secondary | ICD-10-CM | POA: Insufficient documentation

## 2022-01-25 DIAGNOSIS — R509 Fever, unspecified: Secondary | ICD-10-CM | POA: Insufficient documentation

## 2022-01-25 DIAGNOSIS — M7989 Other specified soft tissue disorders: Secondary | ICD-10-CM | POA: Diagnosis present

## 2022-01-25 DIAGNOSIS — R7401 Elevation of levels of liver transaminase levels: Secondary | ICD-10-CM | POA: Diagnosis not present

## 2022-01-25 LAB — BASIC METABOLIC PANEL
Anion gap: 8 (ref 5–15)
BUN: 10 mg/dL (ref 6–20)
CO2: 25 mmol/L (ref 22–32)
Calcium: 8.9 mg/dL (ref 8.9–10.3)
Chloride: 105 mmol/L (ref 98–111)
Creatinine, Ser: 0.96 mg/dL (ref 0.44–1.00)
GFR, Estimated: >60 mL/min (ref >60)
Glucose, Bld: 96 mg/dL (ref 70–99)
Potassium: 4 mmol/L (ref 3.5–5.1)
Sodium: 138 mmol/L (ref 135–145)

## 2022-01-25 LAB — HEPATIC FUNCTION PANEL
ALT: 73 U/L — ABNORMAL HIGH (ref 0–44)
AST: 44 U/L — ABNORMAL HIGH (ref 15–41)
Albumin: 3.4 g/dL — ABNORMAL LOW (ref 3.5–5.0)
Alkaline Phosphatase: 52 U/L (ref 38–126)
Bilirubin, Direct: <0.1 mg/dL (ref 0.0–0.2)
Total Bilirubin: 0.1 mg/dL — ABNORMAL LOW (ref 0.3–1.2)
Total Protein: 5.8 g/dL — ABNORMAL LOW (ref 6.5–8.1)

## 2022-01-25 LAB — URINALYSIS, ROUTINE W REFLEX MICROSCOPIC
Bacteria, UA: NONE SEEN
Bilirubin Urine: NEGATIVE
Glucose, UA: NEGATIVE mg/dL
Ketones, ur: NEGATIVE mg/dL
Leukocytes,Ua: NEGATIVE
Nitrite: NEGATIVE
Protein, ur: NEGATIVE mg/dL
Specific Gravity, Urine: 1.003 — ABNORMAL LOW (ref 1.005–1.030)
pH: 7 (ref 5.0–8.0)

## 2022-01-25 LAB — TROPONIN I (HIGH SENSITIVITY)
Troponin I (High Sensitivity): 7 ng/L (ref <18)
Troponin I (High Sensitivity): 3 ng/L (ref <18)

## 2022-01-25 LAB — CBC
HCT: 33.1 % — ABNORMAL LOW (ref 36.0–46.0)
Hemoglobin: 10.9 g/dL — ABNORMAL LOW (ref 12.0–15.0)
MCH: 33.6 pg (ref 26.0–34.0)
MCHC: 32.9 g/dL (ref 30.0–36.0)
MCV: 102.2 fL — ABNORMAL HIGH (ref 80.0–100.0)
Platelets: 239 10*3/uL (ref 150–400)
RBC: 3.24 MIL/uL — ABNORMAL LOW (ref 3.87–5.11)
RDW: 14.2 % (ref 11.5–15.5)
WBC: 10.2 10*3/uL (ref 4.0–10.5)
nRBC: 0 % (ref 0.0–0.2)

## 2022-01-25 LAB — BRAIN NATRIURETIC PEPTIDE: B Natriuretic Peptide: 127.3 pg/mL — ABNORMAL HIGH (ref 0.0–100.0)

## 2022-01-25 MED ORDER — HYDROCODONE-ACETAMINOPHEN 5-325 MG PO TABS
2.0000 | ORAL_TABLET | Freq: Once | ORAL | Status: AC
  Administered 2022-01-25: 2 via ORAL
  Filled 2022-01-25: qty 2

## 2022-01-25 MED ORDER — FUROSEMIDE 10 MG/ML IJ SOLN
40.0000 mg | Freq: Once | INTRAMUSCULAR | Status: AC
  Administered 2022-01-25: 40 mg via INTRAVENOUS
  Filled 2022-01-25: qty 4

## 2022-01-25 MED ORDER — MORPHINE SULFATE (PF) 4 MG/ML IV SOLN
4.0000 mg | Freq: Once | INTRAVENOUS | Status: AC
  Administered 2022-01-25: 4 mg via INTRAVENOUS
  Filled 2022-01-25: qty 1

## 2022-01-25 MED ORDER — MORPHINE SULFATE (PF) 4 MG/ML IV SOLN
4.0000 mg | Freq: Once | INTRAVENOUS | Status: AC
Start: 2022-01-25 — End: 2022-01-25
  Administered 2022-01-25: 4 mg via INTRAVENOUS
  Filled 2022-01-25: qty 1

## 2022-01-25 NOTE — ED Provider Notes (Signed)
?[facility] ?Provider Note ? ? ?CSN: [id number] ?Arrival date & time: 01/25/22  [id number] ? ?  ? ?History ?Chief Complaint  ?Patient presents with  ? Leg Swelling  ? ? ?Kelli Juarez is a 51 y.o. female. ? ?Patient with history of hypertension presents with bilateral leg swelling that started 3 days ago and has worsened since onset. Husband also present at bedside. She has experienced this before but reports that it was not this bad and so she managed at home with elevating her legs and then it improved. Endorsing epigastric pain, orthopnea and dyspnea although she says that she has been experiencing dyspnea for years but denies any worsening shortness of breath or chest pain. Takes lasix at home for her hypertension and endorses daily compliance. She has been urinating normally. Endorsing fever of Tmax 102 but denies any other symptoms or sick contacts. She does not have a diagnosis of heart failure that she is aware of. Shares that her legs swell sometimes but never this bad and she can usually elevate her legs at home to resolve the swelling. Sees cardiologist outpatient for her hypertension. Tobacco user since the age of 51 years old. Denies recent travel or being in the car for long periods of time. Denies history of blood clots. Endorses painful lower extremities bilaterally with tingling sensation, uses a cane to assist with ambulation at home as well.  ? ?The history is provided by the patient. No language interpreter was used.  ? ?  ? ?Home Medications ?Prior to Admission medications   ?Medication Sig Start Date End Date Taking? Authorizing Provider  ?albuterol (VENTOLIN HFA) 108 (90 Base) MCG/ACT inhaler Inhale 2 puffs into the lungs every 6 (six) hours as needed for wheezing or shortness of breath. 11/19/21   [provider]  ?amphetamine-dextroamphetamine (ADDERALL) 15 MG tablet Take 15 mg by mouth daily. 01/06/22   [provider]  ?busPIRone (BUSPAR) 15  MG tablet Take 30 mg by mouth 2 (two) times daily.    [provider]  ?diazepam (VALIUM) 5 MG tablet Take 5 mg by mouth 3 (three) times daily as needed for anxiety. 01/05/22   [provider]  ?FLUoxetine (PROZAC) 20 MG capsule Take 80 mg by mouth daily.    [provider]  ?gabapentin (NEURONTIN) 100 MG capsule Take 100 mg by mouth 3 (three) times daily. 01/14/22   [provider]  ?hydrocortisone 2.5 % cream Apply 1 application. topically 2 (two) times daily. 11/01/21   [provider]  ?lisinopril (ZESTRIL) 40 MG tablet Take 40 mg by mouth daily. 01/10/22   [provider]  ?lisinopril-hydrochlorothiazide (ZESTORETIC) 20-25 MG tablet Take 1 tablet by mouth daily. ?Patient not taking: Reported on 01/25/2022 08/01/19 08/31/19  Kerin Perna, NP  ?oxyCODONE-acetaminophen (PERCOCET) 10-325 MG tablet Take 1 tablet by mouth See admin instructions. 4-5 times a day    [provider]  ?   ? ?Allergies    ?Penicillins and Toradol [ketorolac tromethamine]   ? ?Review of Systems   ?Review of Systems  ?Constitutional:  Positive for fever. Negative for chills.  ?HENT:  Negative for congestion and rhinorrhea.   ?Respiratory:  Positive for shortness of breath. Negative for cough and wheezing.   ?Cardiovascular:  Positive for leg swelling. Negative for chest pain.  ?Gastrointestinal:  Negative for abdominal pain, nausea and vomiting.  ?Genitourinary:  Negative for decreased urine volume and dysuria.  ?Neurological:  Negative for weakness.  ? ?Physical Exam ?  Updated Vital Signs ?BP (!) 138/108   Pulse 75   Temp 98.2 ?F (36.8 ?C) (Oral)   Resp 10   SpO2 97%  ?Physical Exam ?Vitals reviewed.  ?Constitutional:   ?   General: She is not in acute distress. ?HENT:  ?   Head: Normocephalic and atraumatic.  ?   Nose: No congestion or rhinorrhea.  ?Eyes:  ?   General: No scleral icterus.    ?   Right eye: No discharge.     ?   Left eye: No discharge.  ?   Conjunctiva/sclera:  Conjunctivae normal.  ?Neck:  ?   Comments: Presence of mild JVD ?Cardiovascular:  ?   Rate and Rhythm: Normal rate and regular rhythm.  ?   Pulses: Normal pulses.  ?   Heart sounds: Murmur heard.  ?Pulmonary:  ?   Effort: Pulmonary effort is normal. No respiratory distress.  ?   Breath sounds: Rales (noted diffusely) present.  ?   Comments: Breathing comfortably on room air  ?Chest:  ?   Chest wall: No tenderness.  ?Abdominal:  ?   General: Bowel sounds are normal.  ?   Palpations: Abdomen is soft.  ?   Tenderness: There is no abdominal tenderness.  ?Musculoskeletal:     ?   General: Swelling present.  ?   Cervical back: Normal range of motion.  ?   Right lower leg: Edema present.  ?   Left lower leg: Edema present.  ?   Comments: 2+ pitting edema noted bilaterally to knees including pedal edema with left >right   ?Neurological:  ?   General: No focal deficit present.  ?   Mental Status: She is alert.  ?Psychiatric:     ?   Mood and Affect: Mood normal.  ? ? ?ED Results / Procedures / Treatments   ?Labs ?(all labs ordered are listed, but only abnormal results are displayed) ?Labs Reviewed  ?CBC - Abnormal; Notable for the following components:  ?    Result Value  ? RBC 3.24 (*)   ? Hemoglobin 10.9 (*)   ? HCT 33.1 (*)   ? MCV 102.2 (*)   ? All other components within normal limits  ?BRAIN NATRIURETIC PEPTIDE - Abnormal; Notable for the following components:  ? B Natriuretic Peptide 127.3 (*)   ? All other components within normal limits  ?HEPATIC FUNCTION PANEL - Abnormal; Notable for the following components:  ? Total Protein 5.8 (*)   ? Albumin 3.4 (*)   ? AST 44 (*)   ? ALT 73 (*)   ? Total Bilirubin 0.1 (*)   ? All other components within normal limits  ?URINALYSIS, ROUTINE W REFLEX MICROSCOPIC - Abnormal; Notable for the following components:  ? Color, Urine COLORLESS (*)   ? Specific Gravity, Urine 1.003 (*)   ? Hgb urine dipstick SMALL (*)   ? All other components within normal limits  ?BASIC METABOLIC PANEL   ?TROPONIN I (HIGH SENSITIVITY)  ?TROPONIN I (HIGH SENSITIVITY)  ? ? ?EKG ?None ? ?Radiology ?DG Chest 2 View ? ?Result Date: 01/25/2022 ?CLINICAL DATA:  Provided history: Shortness of breath. Additional history provided: Left arm pain, shortness of breath, leg swelling, history of hypertension. EXAM: CHEST - 2 VIEW COMPARISON:  No pertinent prior exams available for comparison. FINDINGS: Mild cardiomegaly. Central pulmonary vascular congestion with possible mild interstitial edema. No evidence of pleural effusion or pneumothorax. No acute bony abnormality identified. Surgical clips within the upper abdomen. IMPRESSION: Mild  cardiomegaly. Central pulmonary vascular congestion with possible mild interstitial edema. Electronically Signed   By: Kellie Simmering D.O.   On: 01/25/2022 11:07   ? ?Procedures ?Procedures  ? ?Medications Ordered in ED ?Medications  ?morphine (PF) 4 MG/ML injection 4 mg (4 mg Intravenous Given 01/25/22 1213)  ?furosemide (LASIX) injection 40 mg (40 mg Intravenous Given 01/25/22 1215)  ?morphine (PF) 4 MG/ML injection 4 mg (4 mg Intravenous Given 01/25/22 1348)  ?HYDROcodone-acetaminophen (NORCO/VICODIN) 5-325 MG per tablet 2 tablet (2 tablets Oral Given 01/25/22 1346)  ? ? ?ED Course/ Medical Decision Making/ A&P ?  ?                        ?Medical Decision Making ?Amount and/or Complexity of Data Reviewed ?Labs: ordered. ?Radiology: ordered. ? ?Risk ?Prescription drug management. ? ?Critical Care ?Total time providing critical care: 60 minutes ? ?Patient with history of hypertension on lasix for blood pressure follows up with cardiology, presents with bilateral lower extremity edema that has been ongoing for the past 3 days. Given exam findings of JVD, crackles and significant lower extremity edema along with orthopnea, I am concerned about a heart failure exacerbation. LFTs mildly elevated at AST 44 and ALT 73. Troponin 7>3. BNP elevated at 127. BMP unremarkable with electrolytes within normal range and  normal renal function. CXR notable for mild cardiomegaly with central pulmonary vascular congestion with possible mild interstitial edema. Ordered lasix 40 mg as patient would benefit from further diu

## 2022-01-25 NOTE — Discharge Instructions (Addendum)
You were experiencing leg swelling, I am concerned that this is due to your heart that caused you to be fluid overloaded. We gave you more lasix while you were in the emergency department which seemed to help get more fluid off. Please increase your lasix dose to 80 mg for the next 4 days. A cardiology referral has been placed so they should be in contact with you soon, please make sure to schedule an appointment with them at your earliest convenience. Please also make sure to follow up with your primary care doctor.  ?

## 2022-01-25 NOTE — ED Triage Notes (Signed)
Pt c/o bilateral leg swelling x 2 days, along with generalized pain and nasal congestion. ?

## 2022-02-07 ENCOUNTER — Encounter: Payer: Self-pay | Admitting: Cardiology

## 2022-02-07 ENCOUNTER — Ambulatory Visit (INDEPENDENT_AMBULATORY_CARE_PROVIDER_SITE_OTHER): Payer: BC Managed Care – PPO

## 2022-02-07 ENCOUNTER — Ambulatory Visit (INDEPENDENT_AMBULATORY_CARE_PROVIDER_SITE_OTHER): Payer: BC Managed Care – PPO | Admitting: Cardiology

## 2022-02-07 DIAGNOSIS — R600 Localized edema: Secondary | ICD-10-CM | POA: Diagnosis not present

## 2022-02-07 DIAGNOSIS — R0609 Other forms of dyspnea: Secondary | ICD-10-CM | POA: Diagnosis not present

## 2022-02-07 DIAGNOSIS — R55 Syncope and collapse: Secondary | ICD-10-CM

## 2022-02-07 DIAGNOSIS — R0600 Dyspnea, unspecified: Secondary | ICD-10-CM | POA: Insufficient documentation

## 2022-02-07 DIAGNOSIS — R0601 Orthopnea: Secondary | ICD-10-CM | POA: Diagnosis not present

## 2022-02-07 DIAGNOSIS — R002 Palpitations: Secondary | ICD-10-CM

## 2022-02-07 MED ORDER — FUROSEMIDE 20 MG PO TABS: 20.0000 mg | ORAL_TABLET | Freq: Every day | ORAL | 6 refills | Status: DC | PRN

## 2022-02-07 NOTE — Patient Instructions (Addendum)
Medication Instructions:  ? ? May take Furosemide 20 mg  one tablet daily as needed for swelling. ? ?*If you need a refill on your cardiac medications before your next appointment, please call your pharmacy* ? ? ?Lab Work: ? ?Not needed. ? ? ?Testing/Procedures: ?Will be schedule at [address] ?Your physician has requested that you have an echocardiogram. Echocardiography is a painless test that uses sound waves to create images of your heart. It provides your doctor with information about the size and shape of your heart and how well your heart?s chambers and valves are working. This procedure takes approximately one hour. There are no restrictions for this procedure. ? ?And ?Will be schedule at [address] ?Your physician has requested that you have a lower  extremity venous duplex-Reflux. This test is an ultrasound of the veins in the legs . It looks at venous blood flow that carries blood from the heart to the legs. Allow one hour for a Lower Venous exam. There are no restrictions or special instructions. ?And  ?Your physician has requested that you have a carotid duplex. This test is an ultrasound of the carotid arteries in your neck. It looks at blood flow through these arteries that supply the brain with blood. Allow one hour for this exam. There are no restrictions or special instructions.  ?and ?Will be mailed to you in the next 3 to 5  business days ?Your physician has recommended that you wear a holter monitor -14 day. Holter monitors are medical devices that record the heart?s electrical activity. Doctors most often use these monitors to diagnose arrhythmias. Arrhythmias are problems with the speed or rhythm of the heartbeat. The monitor is a small, portable device. You can wear one while you do your normal daily activities. This is usually used to diagnose what is causing palpitations/syncope (passing out). ? ?Follow-Up: ?At [facility], you and your health  needs are our priority.  As part of our continuing mission to provide you with exceptional heart care, we have created designated Provider Care Teams.  These Care Teams include your primary Cardiologist (physician) and Advanced Practice Providers (APPs -  Physician Assistants and Nurse Practitioners) who all work together to provide you with the care you need, when you need it. ? ?  ? ?Your next appointment:   ?2 month(s) ? ?The format for your next appointment:   ?In Person ? ?Provider:   ?Glenetta Hew, MD  ? ? ?Other Instructions  ?ZIO XT- Long Term Monitor Instructions ? ?Your physician has requested you wear a ZIO patch monitor for 14 days.  ?This is a single patch monitor. [employer] supplies one patch monitor per enrollment. Additional ?stickers are not available. Please do not apply patch if you will be having a Nuclear Stress Test,  ?Echocardiogram, Cardiac CT, MRI, or Chest Xray during the period you would be wearing the  ?monitor. The patch cannot be worn during these tests. You cannot remove and re-apply the  ?ZIO XT patch monitor.  ?Your ZIO patch monitor will be mailed 3 day USPS to your address on file. It may take 3-5 days  ?to receive your monitor after you have been enrolled.  ?Once you have received your monitor, please review the enclosed instructions. Your monitor  ?has already been registered assigning a specific monitor serial # to you. ? ?Billing and Patient Assistance Program Information ? ?We have supplied [employer] with any of your insurance information on file for billing  purposes. ?[employer] offers a sliding scale Patient Assistance Program for patients that do not have  ?insurance, or whose insurance does not completely cover the cost of the ZIO monitor.  ?You must apply for the Patient Assistance Program to qualify for this discounted rate.  ?To apply, please call [employer] at [phone], select option 4, select option 2, ask to apply for  ?Patient Assistance Program. Theodore Demark will ask your  household income, and how many people  ?are in your household. They will quote your out-of-pocket cost based on that information.  ?[employer] will also be able to set up a 64-month, interest-free payment plan if needed. ? ?Applying the monitor ?  ?Shave hair from upper left chest.  ?Hold abrader disc by orange tab. Rub abrader in 40 strokes over the upper left chest as  ?indicated in your monitor instructions.  ?Clean area with 4 enclosed alcohol pads. Let dry.  ?Apply patch as indicated in monitor instructions. Patch will be placed under collarbone on left  ?side of chest with arrow pointing upward.  ?Rub patch adhesive wings for 2 minutes. Remove white label marked "1". Remove the white  ?label marked "2". Rub patch adhesive wings for 2 additional minutes.  ?While looking in a mirror, press and release button in center of patch. A small green light will  ?flash 3-4 times. This will be your only indicator that the monitor has been turned on.  ?Do not shower for the first 24 hours. You may shower after the first 24 hours.  ?Press the button if you feel a symptom. You will hear a small click. Record Date, Time and  ?Symptom in the Patient Logbook.  ?When you are ready to remove the patch, follow instructions on the last 2 pages of Patient  ?Logbook. Stick patch monitor onto the last page of Patient Logbook.  ?Place Patient Logbook in the blue and white box. Use locking tab on box and tape box closed  ?securely. The blue and white box has prepaid postage on it. Please place it in the mailbox as  ?soon as possible. Your physician should have your test results approximately 7 days after the  ?monitor has been mailed back to [facility].  ?Call [facility] at [phone] if you have questions regarding  ?your ZIO XT patch monitor. Call them immediately if you see an orange light blinking on your  ?monitor.  ?If your monitor falls off in less than 4 days, contact our Monitor department at [phone].   ?If your monitor becomes loose or falls off after 4 days call [employer] at [phone] for  ?suggestions on securing your monitor  ?

## 2022-02-07 NOTE — Progress Notes (Signed)
? ? ?Primary Care Provider: Kerin Perna, NP ? Aurelio Jew, PA-C as PCP - General (Physician Assistant) ? [place] [facility] Cardiologist: Glenetta Hew, MD ?Electrophysiologist: None ?Doran Stabler, PA-C (Gastroenterology ? ?Clinic Note: ?Chief Complaint  ?Patient presents with  ? New Patient (Initial Visit)  ?  Original reason for referral is edema, but has also noticed palpitations and a passout episode.  ? ?=================================== ? ?ASSESSMENT/PLAN  ? ?Problem List Items Addressed This Visit   ? ? Bilateral lower extremity edema  ?  Bilateral lower edema withou significant PND orthopnea.  She does have exertional dyspnea but but is very deconditioned. ? ?Plan: Check lower extremity Dopplers for venous stasis/reflux along with 2D echocardiogram. ?She says her swelling got worse when she was taken off of HCTZ but I still see comment on Lasix.  Lasix is not listed on her current meds but is clearly listed on her meds from [facility]. => We will refill prescription for Lasix 20 mg, initially written as PRN, we will see how she needs.  I suspect that she will probably need to be back on the dose she was on. ?  ?  ? Relevant Orders  ? EKG 12-Lead (Completed)  ? ECHOCARDIOGRAM COMPLETE  ? VAS [place] LOWER EXTREMITY VENOUS REFLUX  ? VAS US CAROTID  ? Orthopnea  ?  Short of breath laying would suggest possibility of CHF.  Previously been on Lasix along with lisinopril for afterload reduction. ?Plan: We will check 2D echo.  She has stress echo done 2 years ago that was nonischemic.  However there was not a full detailed report of LV function etc. ? ?  ?  ? Relevant Orders  ? EKG 12-Lead (Completed)  ? ECHOCARDIOGRAM COMPLETE  ? DOE (dyspnea on exertion)  ?  Quite deconditioned and is possible the reason for her dyspnea on exertion however she is notably more dyspneic even at rest but more so with exertion. ? ?Check a 2D echocardiogram, low threshold to evaluate with  a coronary CTA depending on results. ? ?She is on lisinopril for afterload reduction and with rate of 60 bpm, would not use beta-blocker.  Need to determine if this is CHF related or just COPD from smoking. ? ?I do not know that she would not tolerate CPX. ? ?  ?  ? Relevant Orders  ? EKG 12-Lead (Completed)  ? ECHOCARDIOGRAM COMPLETE  ? VAS [place] LOWER EXTREMITY VENOUS REFLUX  ? Palpitations  ?  She had an episode of syncope that she could not recall having noticed any symptoms of palpitation, however over the last month or 2 she has had 1 or 2 episodes of palpitations that she described as being only couple seconds in duration. ? ?Plan: Check 14-day Zio patch monitor ? ?  ?  ? Relevant Orders  ? EKG 12-Lead (Completed)  ? ECHOCARDIOGRAM COMPLETE  ? LONG TERM MONITOR (3-14 DAYS)  ? Syncope and collapse  ?  Apparently this is happened a couple times but not for several years until this past week.  No real associated symptoms of palpitations that she has had otherwise.  Those palpitations have been short-lived for couple seconds off-and-on for about a month. ? ?Plan: Check 2D echocardiogram, Zio patch monitor for arrhythmia and carotid Dopplers to evaluate for vertebral stenosis. ? ?My suspicion is that she actually did not truly have loss of consciousness and was probably related to her radiculopathy/neuropathy condition with chronic back pain. ? ?  ?  ?  Relevant Orders  ? EKG 12-Lead (Completed)  ? ECHOCARDIOGRAM COMPLETE  ? VAS US CAROTID  ? LONG TERM MONITOR (3-14 DAYS)  ? ?=================================== ? ?HPI:   ? ?KIERNAN Juarez is a 51 y.o. female mild to moderate smoker with PMH notable for Chronic Back/Neck Pain (Status Post Multiple Back Surgeries with Cage and Brace Surgery.),  Anxiety/depression and Hypertension who is being seen today for the evaluation of LEG SWELLING, PALPITATIONS AND SYNCOPE, at the request of Blanchie Dessert, MD. ? ?Previous PCP evaluations ?Feb 12, 2020-PCP follow-up:-For  extremity edema noted.  Echo ordered along with sleep study referral.  Noted 12 pound weight gain => increase furosemide to 40 mg daily, planned cardiology follow-up. => Has never followed up with cardiology at [facility].  Did have a stress echo reviewed below. ?She moved to [place] for just about a year and a half 20 21-20 2023. ?Annual PCP Severiano Gilbert) follow-up October 05, 2021: History of falls-usually mechanical tripping.  Most recently in November 2022-slipped while standing up in the bathtub.  Had noted worsening right hip and LBP pain.  Using a walker to the pain.  Lower extremity weakness and poor balance.-Being followed [facility] clinic: On Percocet along with gabapentin => at that time, she was on clonidine 0 point milligram 3 times daily as well as Lasix 40 mg daily and lisinopril-HCTZ 20-25 mg daily. ? ?SIVANI BULNES was last seen on 11/18/2021-Kerra Jackson-PA: Seen for hypertension.  Interestingly looks like she was probably inadvertently switched from lisinopril-HCTZ 20-25 mg tabs to lisinopril alone 40 mg tabs. ?Noted dyspnea-exertional, more than usual. => PFTs ordered and referred to [facility].  Albuterol ordered. ?Clonidine was discontinued along with lisinopril-HCTZ.;  It appeared that she was still on Lasix 20 mg-2 tabs daily. ? ?Recent Hospitalizations:  ?ER visit 01/25/2022 with leg swelling .  She noted worsening edema over the last 3 days has had chronic swelling.  Does not check routinely weights.  No dysuria or polyuria.  At that time and indicated that she was taken for Lasix 40 mg daily-told the nurse for blood pressure. ?Symptoms felt to be concerning for CHF suggested also by lab results. ?X-ray suggested cardiomegaly and mild edema, labs concerning for heart failure.  => Stable O2 sats.  She had multiple diuresis with IV Lasix.  Plan was discharged home with close cardiology follow-up.  She has to double up her Lasix for the next 4 days. ?  ? ?Reviewed  CV  studies:   ? ?The following studies were reviewed today: (if available, images/films reviewed: From Epic Chart or Care Everywhere) ?07/23/2020: Treadmill Echo: [facility]: EF 65 to 70%.  Normal wall motion.  Normal diastolic function.  Post-rest EF 75%.  No wall motion abnormality.  9.5 METS.  Reached just under 85% max.  Heart rate.  Low prognostic risk.  No evidence of ischemia. ?Sleep study ordered May 2021 ? ? ?Interval History:  ? ?Heath Gold presents here today for evaluation of edema but also mentions palpitations and an episode of syncope. ?Her med list clearly has her no longer taking the lisinopril HCTZ with lisinopril alone listed.  She also was on Lasix but is not listed on current meds. ? ?She notes that her PCP stopped her fluid pill (presumably the HCTZ component of lisinopril and HCTZ (but also reviewed the charts, furosemide was also held during one of the previous visits.  Since then, she has noted progressively worsening left greater than right side swelling  to the point where it is uncomfortable for her to walk and she feels tightness and pressure in the legs.  When she went to the emergency room she noted swelling that resolved after IV Lasix.  Apparently was not responding to oral Lasix.  Was told to double up her home Lasix dose for 4 days. ? ?Presumably, she had not been on either HCTZ or Lasix.  But she did not mention anything to me about Lasix.  She said that swelling definitely improved after the diuretic given in the ER.  She said that time she is swelling all after her hips and it is very difficult to bend that her knees.  This is definitely improved. ? ?She had an episode where she passed out about 3 days ago, fell back and hit her head.  Cannot tell any other notable symptoms.  Does not recall any irregular heartbeats associated with this episode. ? ?She notes that she is had palpitations off and on for last couple months that only last few seconds, relatively fleeting.  She  has had some orthopnea symptoms for the last 6 months or so and least 1 time had an episode of PND.  He notes that she frequently takes deep breaths to try to catch her breath, ? ?She has become quite seden

## 2022-02-07 NOTE — Progress Notes (Unsigned)
Enrolled for [employer] to mail a ZIO XT long term holter monitor to the patients address on file.  

## 2022-02-08 ENCOUNTER — Encounter: Payer: Self-pay | Admitting: Cardiology

## 2022-02-08 NOTE — Assessment & Plan Note (Signed)
Quite deconditioned and is possible the reason for her dyspnea on exertion however she is notably more dyspneic even at rest but more so with exertion. ? ?Check a 2D echocardiogram, low threshold to evaluate with a coronary CTA depending on results. ? ?She is on lisinopril for afterload reduction and with rate of 60 bpm, would not use beta-blocker.  Need to determine if this is CHF related or just COPD from smoking. ? ?I do not know that she would not tolerate CPX. ?

## 2022-02-08 NOTE — Assessment & Plan Note (Signed)
Apparently this is happened a couple times but not for several years until this past week.  No real associated symptoms of palpitations that she has had otherwise.  Those palpitations have been short-lived for couple seconds off-and-on for about a month. ? ?Plan: Check 2D echocardiogram, Zio patch monitor for arrhythmia and carotid Dopplers to evaluate for vertebral stenosis. ? ?My suspicion is that she actually did not truly have loss of consciousness and was probably related to her radiculopathy/neuropathy condition with chronic back pain. ?

## 2022-02-08 NOTE — Assessment & Plan Note (Addendum)
Bilateral lower edema withou significant PND orthopnea.  She does have exertional dyspnea but but is very deconditioned. ? ?Plan: Check lower extremity Dopplers for venous stasis/reflux along with 2D echocardiogram. ?? She says her swelling got worse when she was taken off of HCTZ but I still see comment on Lasix.  Lasix is not listed on her current meds but is clearly listed on her meds from [facility]. => We will refill prescription for Lasix 20 mg, initially written as PRN, we will see how she needs.  I suspect that she will probably need to be back on the dose she was on. ?

## 2022-02-08 NOTE — Assessment & Plan Note (Signed)
Short of breath laying would suggest possibility of CHF.  Previously been on Lasix along with lisinopril for afterload reduction. ?Plan: We will check 2D echo.  She has stress echo done 2 years ago that was nonischemic.  However there was not a full detailed report of LV function etc. ?

## 2022-02-08 NOTE — Assessment & Plan Note (Addendum)
She had an episode of syncope that she could not recall having noticed any symptoms of palpitation, however over the last month or 2 she has had 1 or 2 episodes of palpitations that she described as being only couple seconds in duration. ? ?Plan: Check 14-day Zio patch monitor ?

## 2022-02-09 DIAGNOSIS — R55 Syncope and collapse: Secondary | ICD-10-CM | POA: Diagnosis not present

## 2022-02-09 DIAGNOSIS — R002 Palpitations: Secondary | ICD-10-CM

## 2022-02-10 ENCOUNTER — Encounter (HOSPITAL_COMMUNITY): Payer: BC Managed Care – PPO

## 2022-02-23 ENCOUNTER — Ambulatory Visit (INDEPENDENT_AMBULATORY_CARE_PROVIDER_SITE_OTHER): Payer: Medicaid Other | Admitting: Pulmonary Disease

## 2022-02-23 ENCOUNTER — Encounter: Payer: Self-pay | Admitting: Pulmonary Disease

## 2022-02-23 VITALS — BP 126/72 | HR 62 | Ht 61.0 in | Wt 192.4 lb

## 2022-02-23 DIAGNOSIS — R0601 Orthopnea: Secondary | ICD-10-CM | POA: Diagnosis not present

## 2022-02-23 DIAGNOSIS — R0683 Snoring: Secondary | ICD-10-CM

## 2022-02-23 DIAGNOSIS — F17210 Nicotine dependence, cigarettes, uncomplicated: Secondary | ICD-10-CM | POA: Diagnosis not present

## 2022-02-23 DIAGNOSIS — R0602 Shortness of breath: Secondary | ICD-10-CM | POA: Diagnosis not present

## 2022-02-23 MED ORDER — ANORO ELLIPTA 62.5-25 MCG/ACT IN AEPB: 1.0000 | INHALATION_SPRAY | Freq: Every day | RESPIRATORY_TRACT | 6 refills | Status: DC

## 2022-02-23 MED ORDER — ALBUTEROL SULFATE (2.5 MG/3ML) 0.083% IN NEBU
2.5000 mg | INHALATION_SOLUTION | Freq: Four times a day (QID) | RESPIRATORY_TRACT | 5 refills | Status: AC | PRN
Start: 2022-02-23 — End: ?

## 2022-02-23 NOTE — Patient Instructions (Addendum)
Recommend quitting smoking: - use nicotine patches 7mg  daily - use mini nicotine lozenges as needed for break through cravings   Start anoro ellipta 1 puff daily   Follow up in 2 months with pulmonary function tests

## 2022-02-23 NOTE — Progress Notes (Signed)
Synopsis: Referred in May 2023 for wheezing and dyspnea by Warnell Forester, NP  Subjective:   PATIENT ID: Kelli Juarez GENDER: female DOB: [birth year], MRN: [id number]  HPI  Chief Complaint  Patient presents with   Consult    Referred by PCP for possible COPD. States she has been more SOB over the past 3 years. Has a productive cough with clear phlegm.    Kelli Juarez is a 21 year woman, daily smoker with history of ADHD, anxiety, depression, chronic back pain and hypertension who is referred to pulmonary clinic for wheezing and shortness of breath.   She was seen 02/07/22 by Dr. Herbie Baltimore of cardiology where she was evaluated for lower extremity edema. Echo and lower extremity US were ordered.  She reports progressive dyspnea over the past 3 years and has a significant decline in her physical activity level over the past 5 years. She has gained rouhgly 65lbs in the past few years. She has dyspnea at rest and with exertion. She has cough and wheezing with the dyspnea. She reports night time awakenings almost nightly due to cough, wheezing and dyspnea. She reports having issues with dizziness and losing consciousness when she has shortness of breath.   She lives with her husband. She is disabled due to back pain. She is smoking 8 cigarettes daily. She was smoking 2 packs per day for 36 years. She crochets in her spare time.   Past Medical History:  Diagnosis Date   ADHD    On Adderall   Anxiety and depression    Chronic back pain    Status post multiple back and neck surgeries.=> Followed by [facility]-takes Percocet plus gabapentin.   Hypertension      No family history on file.   Social History   Socioeconomic History   Marital status: Married    Spouse name: Not on file   Number of children: Not on file   Years of education: Not on file   Highest education level: Not on file  Occupational History   Not on file  Tobacco Use   Smoking status: Every Day     Packs/day: 0.35    Types: Cigarettes   Smokeless tobacco: Never  Vaping Use   Vaping Use: Never used  Substance and Sexual Activity   Alcohol use: Never   Drug use: Never   Sexual activity: Yes  Other Topics Concern   Not on file  Social History Narrative   Had previously been very active until her back issues began and then she became quite sedentary.      She is a relatively light smoker having cut down from about half a pack a day to a third of a pack a day, but still smokes.   Social Determinants of Health   Financial Resource Strain: Not on file  Food Insecurity: Not on file  Transportation Needs: Not on file  Physical Activity: Not on file  Stress: Not on file  Social Connections: Not on file  Intimate Partner Violence: Not on file     Allergies  Allergen Reactions   Penicillins Anaphylaxis    Reports throat closes up   Toradol [Ketorolac Tromethamine] Other (See Comments)    AMS     Outpatient Medications Prior to Visit  Medication Sig Dispense Refill   amphetamine-dextroamphetamine (ADDERALL) 15 MG tablet Take 15 mg by mouth daily.     busPIRone (BUSPAR) 15 MG tablet Take 30 mg by mouth 2 (two) times daily.  diazepam (VALIUM) 5 MG tablet Take 5 mg by mouth 3 (three) times daily as needed for anxiety.     FLUoxetine (PROZAC) 20 MG capsule Take 80 mg by mouth daily.     furosemide (LASIX) 20 MG tablet Take 1 tablet (20 mg total) by mouth daily as needed. 30 tablet 6   gabapentin (NEURONTIN) 100 MG capsule Take 100 mg by mouth 3 (three) times daily.     hydrocortisone 2.5 % cream Apply 1 application. topically 2 (two) times daily.     lisinopril (ZESTRIL) 40 MG tablet Take 40 mg by mouth daily.     oxyCODONE-acetaminophen (PERCOCET) 10-325 MG tablet Take 1 tablet by mouth See admin instructions. 4-5 times a day     albuterol (VENTOLIN HFA) 108 (90 Base) MCG/ACT inhaler Inhale 2 puffs into the lungs every 6 (six) hours as needed for wheezing or shortness of  breath. (Patient not taking: Reported on 02/07/2022)     lisinopril-hydrochlorothiazide (ZESTORETIC) 20-25 MG tablet Take 1 tablet by mouth daily. (Patient not taking: Reported on 01/25/2022) 30 tablet 3   No facility-administered medications prior to visit.    Review of Systems  Constitutional:  Negative for chills, fever, malaise/fatigue and weight loss.  HENT:  Negative for congestion, sinus pain and sore throat.   Eyes: Negative.   Respiratory:  Positive for shortness of breath and wheezing. Negative for cough, hemoptysis and sputum production.   Cardiovascular:  Positive for orthopnea. Negative for chest pain, palpitations, claudication and leg swelling.  Gastrointestinal:  Negative for abdominal pain, heartburn, nausea and vomiting.  Genitourinary: Negative.   Musculoskeletal:  Positive for back pain. Negative for joint pain and myalgias.  Skin:  Negative for rash.  Neurological:  Negative for weakness.  Endo/Heme/Allergies: Negative.   Psychiatric/Behavioral: Negative.       Objective:   Vitals:   02/23/22 1056  BP: 126/72  Pulse: 62  SpO2: 97%  Weight: 192 lb 6.4 oz (87.3 kg)  Height: 5\' 1"  (1.549 m)     Physical Exam Constitutional:      General: She is not in acute distress.    Appearance: She is obese. She is not ill-appearing.  HENT:     Head: Normocephalic and atraumatic.  Eyes:     General: No scleral icterus.    Conjunctiva/sclera: Conjunctivae normal.     Pupils: Pupils are equal, round, and reactive to light.  Cardiovascular:     Rate and Rhythm: Normal rate and regular rhythm.     Pulses: Normal pulses.     Heart sounds: Normal heart sounds. No murmur heard. Pulmonary:     Effort: Pulmonary effort is normal.     Breath sounds: Normal breath sounds. No wheezing, rhonchi or rales.  Abdominal:     General: Bowel sounds are normal.     Palpations: Abdomen is soft.  Musculoskeletal:     Right lower leg: No edema.     Left lower leg: No edema.   Lymphadenopathy:     Cervical: No cervical adenopathy.  Skin:    General: Skin is warm and dry.  Neurological:     General: No focal deficit present.     Mental Status: She is alert.  Psychiatric:        Mood and Affect: Mood normal.        Behavior: Behavior normal.        Thought Content: Thought content normal.        Judgment: Judgment normal.    CBC  Component Value Date/Time   WBC 10.2 01/25/2022 0703   RBC 3.24 (L) 01/25/2022 0703   HGB 10.9 (L) 01/25/2022 0703   HCT 33.1 (L) 01/25/2022 0703   PLT 239 01/25/2022 0703   MCV 102.2 (H) 01/25/2022 0703   MCH 33.6 01/25/2022 0703   MCHC 32.9 01/25/2022 0703   RDW 14.2 01/25/2022 0703      Latest Ref Rng & Units 01/25/2022    7:03 AM  BMP  Glucose 70 - 99 mg/dL 96    BUN 6 - 20 mg/dL 10    Creatinine 2.40 - 1.00 mg/dL 9.73    Sodium 532 - 992 mmol/L 138    Potassium 3.5 - 5.1 mmol/L 4.0    Chloride 98 - 111 mmol/L 105    CO2 22 - 32 mmol/L 25    Calcium 8.9 - 10.3 mg/dL 8.9     Chest imaging: CXR 01/25/22 Mild cardiomegaly. Central pulmonary vascular congestion with possible mild interstitial edema. No evidence of pleural effusion or pneumothorax. No acute bony abnormality identified. Surgical clips within the upper abdomen.  PFT:     View : No data to display.          Labs: BNP 127 on 01/25/22  Path:  Echo:  Heart Catheterization:  Assessment & Plan:   Shortness of breath - Plan: Pulmonary Function Test, umeclidinium-vilanterol (ANORO ELLIPTA) 62.5-25 MCG/ACT AEPB  Orthopnea  Snoring  Cigarette smoker  Discussion: Kelli Juarez is a 57 year woman, daily smoker with history of ADHD, anxiety, depression, chronic back pain and hypertension who is referred to pulmonary clinic for wheezing and shortness of breath.   Her dyspnea is multifactorial. She has history concerning for obstructive lung disease with a 70+ pack year smoking history. She has gained 65lbs in the past few years and has been  very inactive leading to significant deconditioning.   She is to try anoro ellipta 1 puff daily.  We discussed smoking cessation and she is to use nicotine patches 7mg  daily along with mini nicotine lozenges as needed.   She is to follow up in 2 months for pulmonary function tests.   , MD [facility]elli [facility] Office: [phone]   Current Outpatient Medications:    amphetamine-dextroamphetamine (ADDERALL) 15 MG tablet, Take 15 mg by mouth daily., Disp: , Rfl:    busPIRone (BUSPAR) 15 MG tablet, Take 30 mg by mouth 2 (two) times daily., Disp: , Rfl:    diazepam (VALIUM) 5 MG tablet, Take 5 mg by mouth 3 (three) times daily as needed for anxiety., Disp: , Rfl:    FLUoxetine (PROZAC) 20 MG capsule, Take 80 mg by mouth daily., Disp: , Rfl:    furosemide (LASIX) 20 MG tablet, Take 1 tablet (20 mg total) by mouth daily as needed., Disp: 30 tablet, Rfl: 6   gabapentin (NEURONTIN) 100 MG capsule, Take 100 mg by mouth 3 (three) times daily., Disp: , Rfl:    hydrocortisone 2.5 % cream, Apply 1 application. topically 2 (two) times daily., Disp: , Rfl:    lisinopril (ZESTRIL) 40 MG tablet, Take 40 mg by mouth daily., Disp: , Rfl:    oxyCODONE-acetaminophen (PERCOCET) 10-325 MG tablet, Take 1 tablet by mouth See admin instructions. 4-5 times a day, Disp: , Rfl:    umeclidinium-vilanterol (ANORO ELLIPTA) 62.5-25 MCG/ACT AEPB, Inhale 1 puff into the lungs daily., Disp: 60 each, Rfl: 6

## 2022-02-24 ENCOUNTER — Ambulatory Visit (HOSPITAL_COMMUNITY): Payer: Medicaid Other | Attending: Cardiology

## 2022-02-24 DIAGNOSIS — R002 Palpitations: Secondary | ICD-10-CM | POA: Diagnosis not present

## 2022-02-24 DIAGNOSIS — R0609 Other forms of dyspnea: Secondary | ICD-10-CM | POA: Diagnosis not present

## 2022-02-24 DIAGNOSIS — R55 Syncope and collapse: Secondary | ICD-10-CM | POA: Diagnosis present

## 2022-02-24 DIAGNOSIS — R600 Localized edema: Secondary | ICD-10-CM

## 2022-02-24 DIAGNOSIS — R0601 Orthopnea: Secondary | ICD-10-CM | POA: Diagnosis not present

## 2022-02-24 LAB — ECHOCARDIOGRAM COMPLETE
Area-P 1/2: 4.54 cm2
S' Lateral: 2.5 cm

## 2022-02-25 ENCOUNTER — Ambulatory Visit (HOSPITAL_BASED_OUTPATIENT_CLINIC_OR_DEPARTMENT_OTHER)
Admission: RE | Admit: 2022-02-25 | Discharge: 2022-02-25 | Disposition: A | Discharge status: Home / Self Care | Payer: Medicaid Other | Source: Ambulatory Visit | Attending: Cardiology | Admitting: Cardiology

## 2022-02-25 ENCOUNTER — Ambulatory Visit (HOSPITAL_COMMUNITY)
Admission: RE | Admit: 2022-02-25 | Discharge: 2022-02-25 | Disposition: A | Discharge status: Home / Self Care | Payer: Medicaid Other | Source: Ambulatory Visit | Attending: Cardiology | Admitting: Cardiology

## 2022-02-25 DIAGNOSIS — R600 Localized edema: Secondary | ICD-10-CM

## 2022-02-25 DIAGNOSIS — R55 Syncope and collapse: Secondary | ICD-10-CM

## 2022-02-25 DIAGNOSIS — R0609 Other forms of dyspnea: Secondary | ICD-10-CM | POA: Insufficient documentation

## 2022-02-27 ENCOUNTER — Encounter: Payer: Self-pay | Admitting: Pulmonary Disease

## 2022-04-20 DIAGNOSIS — M542 Cervicalgia: Secondary | ICD-10-CM | POA: Insufficient documentation

## 2022-04-20 DIAGNOSIS — M1990 Unspecified osteoarthritis, unspecified site: Secondary | ICD-10-CM | POA: Insufficient documentation

## 2022-04-20 DIAGNOSIS — M25561 Pain in right knee: Secondary | ICD-10-CM | POA: Insufficient documentation

## 2022-04-20 DIAGNOSIS — F17200 Nicotine dependence, unspecified, uncomplicated: Secondary | ICD-10-CM | POA: Insufficient documentation

## 2022-04-20 DIAGNOSIS — F339 Major depressive disorder, recurrent, unspecified: Secondary | ICD-10-CM | POA: Insufficient documentation

## 2022-04-20 DIAGNOSIS — E669 Obesity, unspecified: Secondary | ICD-10-CM | POA: Insufficient documentation

## 2022-04-20 DIAGNOSIS — J449 Chronic obstructive pulmonary disease, unspecified: Secondary | ICD-10-CM | POA: Insufficient documentation

## 2022-04-20 DIAGNOSIS — Z90710 Acquired absence of both cervix and uterus: Secondary | ICD-10-CM | POA: Insufficient documentation

## 2022-04-21 DIAGNOSIS — D72829 Elevated white blood cell count, unspecified: Secondary | ICD-10-CM | POA: Insufficient documentation

## 2022-04-21 DIAGNOSIS — R7301 Impaired fasting glucose: Secondary | ICD-10-CM | POA: Insufficient documentation

## 2022-04-21 DIAGNOSIS — E785 Hyperlipidemia, unspecified: Secondary | ICD-10-CM | POA: Insufficient documentation

## 2022-05-31 DIAGNOSIS — F900 Attention-deficit hyperactivity disorder, predominantly inattentive type: Secondary | ICD-10-CM | POA: Insufficient documentation

## 2022-06-26 ENCOUNTER — Emergency Department (HOSPITAL_COMMUNITY): Payer: Medicaid Other

## 2022-06-26 ENCOUNTER — Encounter (HOSPITAL_COMMUNITY): Payer: Self-pay | Admitting: Emergency Medicine

## 2022-06-26 ENCOUNTER — Other Ambulatory Visit: Payer: Self-pay

## 2022-06-26 ENCOUNTER — Emergency Department (HOSPITAL_COMMUNITY)
Admission: EM | Admit: 2022-06-26 | Discharge: 2022-06-26 | Discharge status: Against Medical Advice | Payer: Medicaid Other | Attending: Emergency Medicine | Admitting: Emergency Medicine

## 2022-06-26 DIAGNOSIS — R1110 Vomiting, unspecified: Secondary | ICD-10-CM | POA: Diagnosis not present

## 2022-06-26 DIAGNOSIS — R109 Unspecified abdominal pain: Secondary | ICD-10-CM | POA: Insufficient documentation

## 2022-06-26 DIAGNOSIS — Z5321 Procedure and treatment not carried out due to patient leaving prior to being seen by health care provider: Secondary | ICD-10-CM | POA: Insufficient documentation

## 2022-06-26 LAB — CBC WITH DIFFERENTIAL/PLATELET
Abs Immature Granulocytes: 0.03 10*3/uL (ref 0.00–0.07)
Basophils Absolute: 0.1 10*3/uL (ref 0.0–0.1)
Basophils Relative: 1 %
Eosinophils Absolute: 0.5 10*3/uL (ref 0.0–0.5)
Eosinophils Relative: 4 %
HCT: 40.1 % (ref 36.0–46.0)
Hemoglobin: 13.6 g/dL (ref 12.0–15.0)
Immature Granulocytes: 0 %
Lymphocytes Relative: 27 %
Lymphs Abs: 3.4 10*3/uL (ref 0.7–4.0)
MCH: 33.3 pg (ref 26.0–34.0)
MCHC: 33.9 g/dL (ref 30.0–36.0)
MCV: 98 fL (ref 80.0–100.0)
Monocytes Absolute: 0.7 10*3/uL (ref 0.1–1.0)
Monocytes Relative: 5 %
Neutro Abs: 8 10*3/uL — ABNORMAL HIGH (ref 1.7–7.7)
Neutrophils Relative %: 63 %
Platelets: 331 10*3/uL (ref 150–400)
RBC: 4.09 MIL/uL (ref 3.87–5.11)
RDW: 13.4 % (ref 11.5–15.5)
WBC: 12.6 10*3/uL — ABNORMAL HIGH (ref 4.0–10.5)
nRBC: 0 % (ref 0.0–0.2)

## 2022-06-26 LAB — URINALYSIS, ROUTINE W REFLEX MICROSCOPIC
Bacteria, UA: NONE SEEN
Bilirubin Urine: NEGATIVE
Glucose, UA: NEGATIVE mg/dL
Ketones, ur: NEGATIVE mg/dL
Leukocytes,Ua: NEGATIVE
Nitrite: NEGATIVE
Protein, ur: NEGATIVE mg/dL
Specific Gravity, Urine: 1.014 (ref 1.005–1.030)
pH: 5 (ref 5.0–8.0)

## 2022-06-26 LAB — COMPREHENSIVE METABOLIC PANEL
ALT: 23 U/L (ref 0–44)
AST: 21 U/L (ref 15–41)
Albumin: 4.5 g/dL (ref 3.5–5.0)
Alkaline Phosphatase: 59 U/L (ref 38–126)
Anion gap: 7 (ref 5–15)
BUN: 17 mg/dL (ref 6–20)
CO2: 26 mmol/L (ref 22–32)
Calcium: 8.9 mg/dL (ref 8.9–10.3)
Chloride: 103 mmol/L (ref 98–111)
Creatinine, Ser: 1.05 mg/dL — ABNORMAL HIGH (ref 0.44–1.00)
GFR, Estimated: >60 mL/min (ref >60)
Glucose, Bld: 105 mg/dL — ABNORMAL HIGH (ref 70–99)
Potassium: 3.6 mmol/L (ref 3.5–5.1)
Sodium: 136 mmol/L (ref 135–145)
Total Bilirubin: 0.7 mg/dL (ref 0.3–1.2)
Total Protein: 7.3 g/dL (ref 6.5–8.1)

## 2022-06-26 LAB — I-STAT BETA HCG BLOOD, ED (MC, WL, AP ONLY): I-stat hCG, quantitative: <5 m[IU]/mL (ref <5)

## 2022-06-26 LAB — LIPASE, BLOOD: Lipase: 33 U/L (ref 11–51)

## 2022-06-26 MED ORDER — HYDROCODONE-ACETAMINOPHEN 5-325 MG PO TABS
1.0000 | ORAL_TABLET | Freq: Once | ORAL | Status: AC
  Administered 2022-06-26: 1 via ORAL
  Filled 2022-06-26: qty 1

## 2022-06-26 MED ORDER — ONDANSETRON 4 MG PO TBDP
8.0000 mg | ORAL_TABLET | Freq: Once | ORAL | Status: AC
  Administered 2022-06-26: 8 mg via ORAL
  Filled 2022-06-26: qty 2

## 2022-06-26 NOTE — ED Notes (Signed)
Pt answer?

## 2022-06-26 NOTE — ED Triage Notes (Signed)
Patient complains of bilateral flank pain, worse on left versus right, that started a few days ago. Patient reports having a kidney stone several years ago and reports this pain feels similar to then.

## 2022-06-26 NOTE — ED Provider Triage Note (Signed)
Emergency Medicine Provider Triage Evaluation Note  Kelli Juarez , a 51 y.o. female  was evaluated in triage.  Pt complains of bilateral flank pain worse on the left.  History of kidney stones.  This feels similar.  Endorses vomiting.  Denies dysuria, or hematuria..  Review of Systems  Positive: As above Negative: As above  Physical Exam  BP (!) 138/104 (BP Location: Right Arm)   Pulse 78   Temp 98.6 F (37 C) (Oral)   Resp 18   SpO2 100%  Gen:   Awake, no distress   Resp:  Normal effort  MSK:   Moves extremities without difficulty  Other:    Medical Decision Making  Medically screening exam initiated at 1:56 PM.  Appropriate orders placed.  Kelli Juarez was informed that the remainder of the evaluation will be completed by another provider, this initial triage assessment does not replace that evaluation, and the importance of remaining in the ED until their evaluation is complete.     Evlyn Courier, PA-C 06/26/22 1356

## 2022-08-25 DIAGNOSIS — R296 Repeated falls: Secondary | ICD-10-CM | POA: Insufficient documentation

## 2022-08-31 DIAGNOSIS — S0990XA Unspecified injury of head, initial encounter: Secondary | ICD-10-CM | POA: Insufficient documentation

## 2022-08-31 DIAGNOSIS — S8990XA Unspecified injury of unspecified lower leg, initial encounter: Secondary | ICD-10-CM | POA: Insufficient documentation

## 2022-09-03 DIAGNOSIS — Z79899 Other long term (current) drug therapy: Secondary | ICD-10-CM | POA: Insufficient documentation

## 2022-09-03 DIAGNOSIS — Z981 Arthrodesis status: Secondary | ICD-10-CM | POA: Insufficient documentation

## 2022-09-03 DIAGNOSIS — F1120 Opioid dependence, uncomplicated: Secondary | ICD-10-CM | POA: Insufficient documentation

## 2022-09-03 DIAGNOSIS — Z87442 Personal history of urinary calculi: Secondary | ICD-10-CM | POA: Insufficient documentation

## 2022-09-04 DIAGNOSIS — I517 Cardiomegaly: Secondary | ICD-10-CM | POA: Insufficient documentation

## 2022-09-04 DIAGNOSIS — S8002XA Contusion of left knee, initial encounter: Secondary | ICD-10-CM | POA: Insufficient documentation

## 2022-09-04 DIAGNOSIS — R0683 Snoring: Secondary | ICD-10-CM | POA: Insufficient documentation

## 2022-09-04 DIAGNOSIS — K648 Other hemorrhoids: Secondary | ICD-10-CM | POA: Insufficient documentation

## 2022-09-04 DIAGNOSIS — T819XXA Unspecified complication of procedure, initial encounter: Secondary | ICD-10-CM | POA: Insufficient documentation

## 2022-09-04 DIAGNOSIS — R5381 Other malaise: Secondary | ICD-10-CM | POA: Insufficient documentation

## 2022-09-28 DIAGNOSIS — K08409 Partial loss of teeth, unspecified cause, unspecified class: Secondary | ICD-10-CM | POA: Insufficient documentation

## 2022-10-05 IMAGING — MR MR CERVICAL SPINE W/O CM
4 of 5 series · 30 of 48 positions shown · non-contrast
Comparison: Prior radiograph from 11/08/2020.

CLINICAL DATA: Initial evaluation for chronic neck pain, right
shoulder pain.

EXAM:
MRI CERVICAL SPINE WITHOUT CONTRAST
TECHNIQUE: Multiplanar, multisequence MR imaging of the cervical spine was
performed. No intravenous contrast was administered.

[Series 2: T2 · sagittal · 3.0mm · 0.82mm/px · 8 of 18 slices shown (1 of 2)]
[im 1/18]
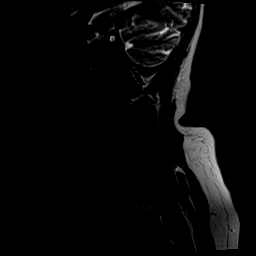
[im 3/18]
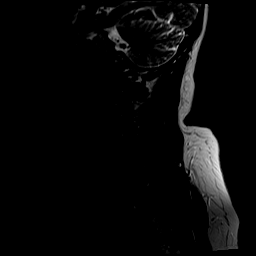
[im 5/18]
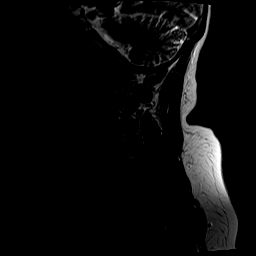
[im 8/18]
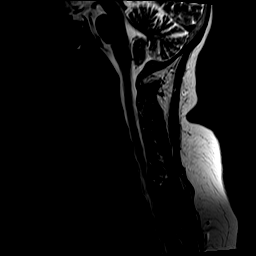
[im 10/18]
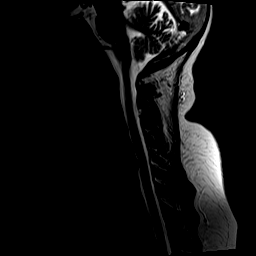
[im 13/18]
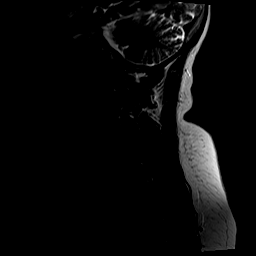
[im 15/18]
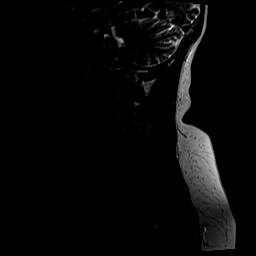
[im 18/18]
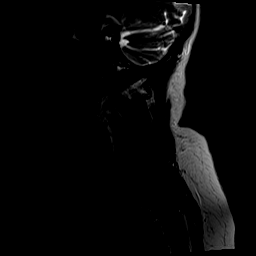

[Series 3: T1 · sagittal · 3.0mm · 0.41mm/px · 7 of 18 slices shown]
[im 1/18]
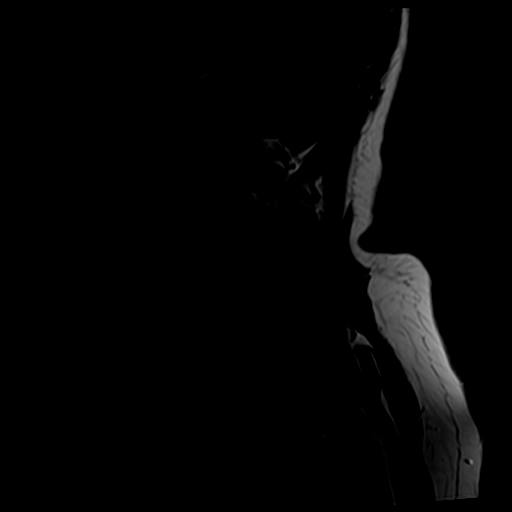
[im 3/18]
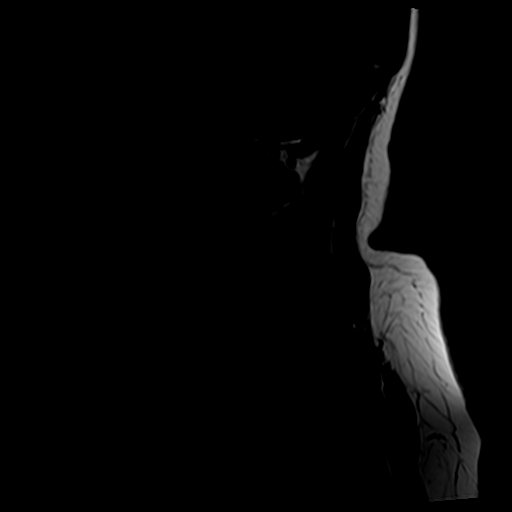
[im 6/18]
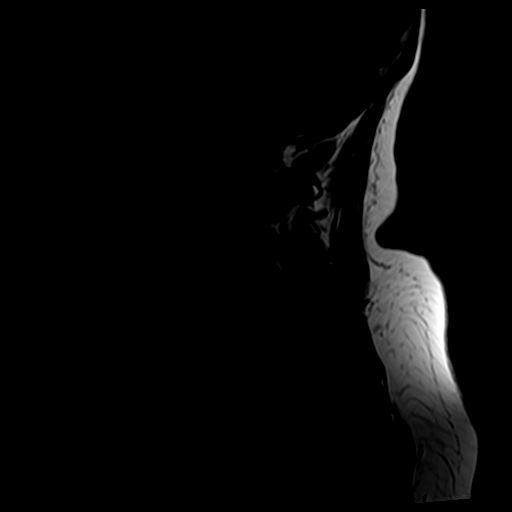
[im 9/18]
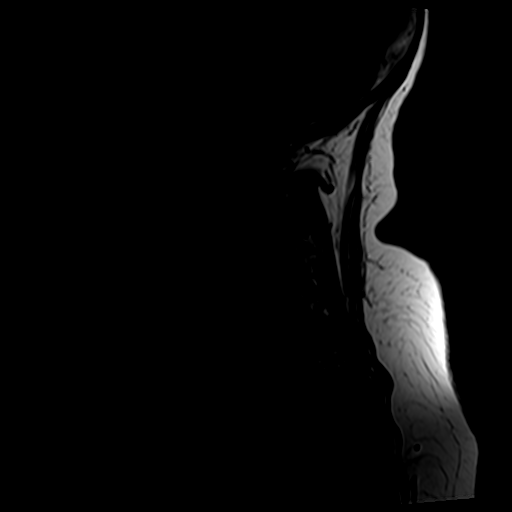
[im 12/18]
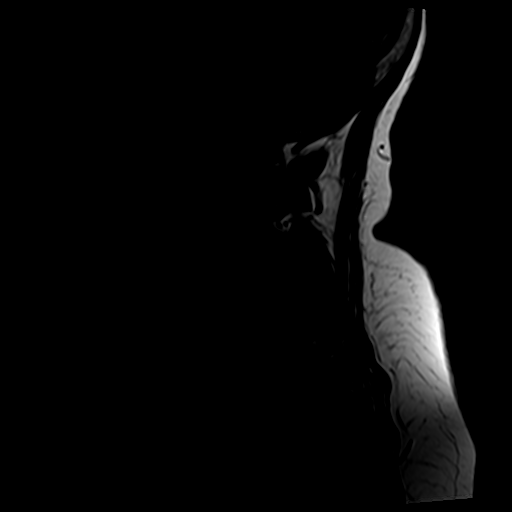
[im 15/18]
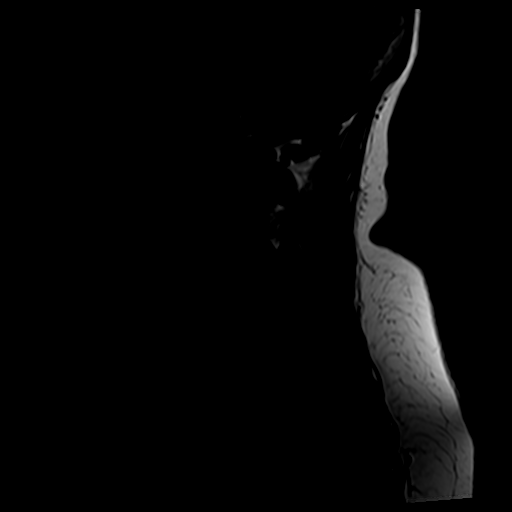
[im 18/18]
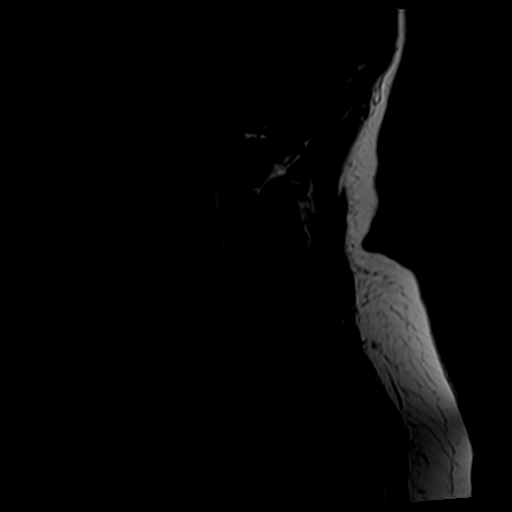

[Series 4: tir sag · sagittal · 3.0mm · 0.43mm/px · 6 of 18 slices shown]
[im 1/18]
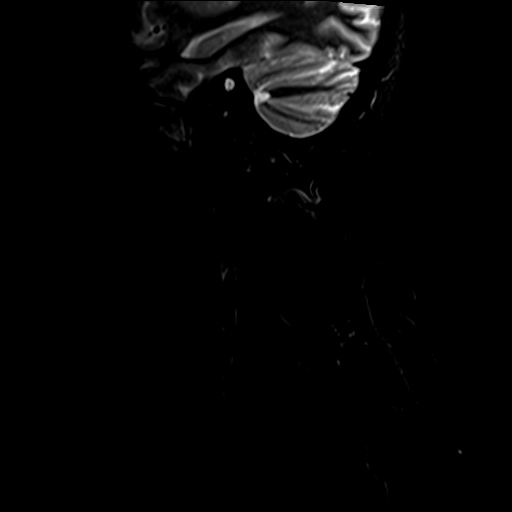
[im 3/18]
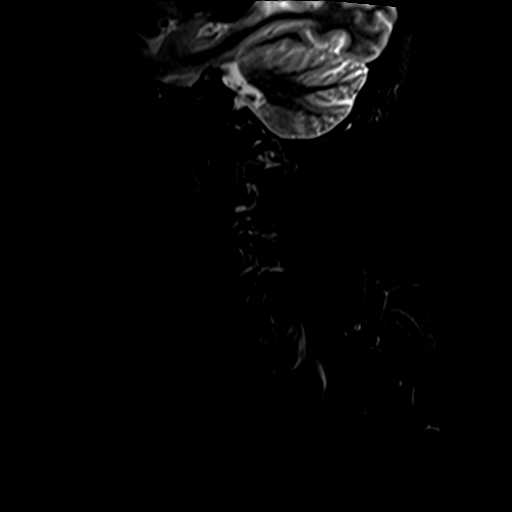
[im 6/18]
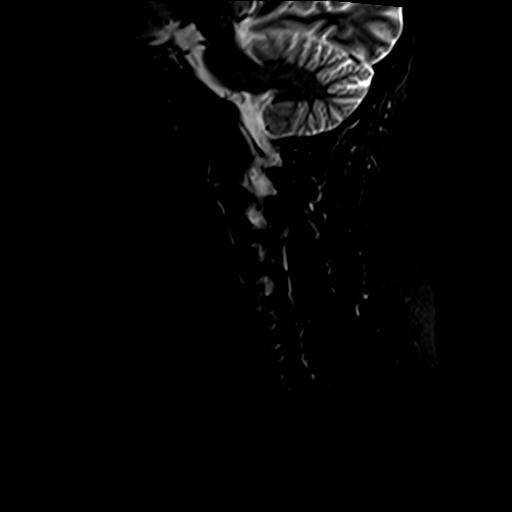
[im 9/18]
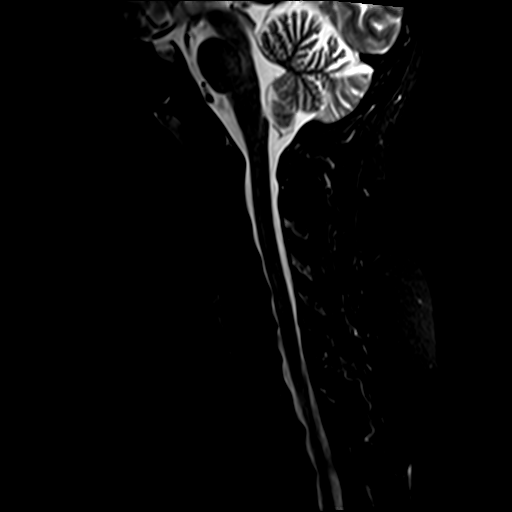
[im 12/18]
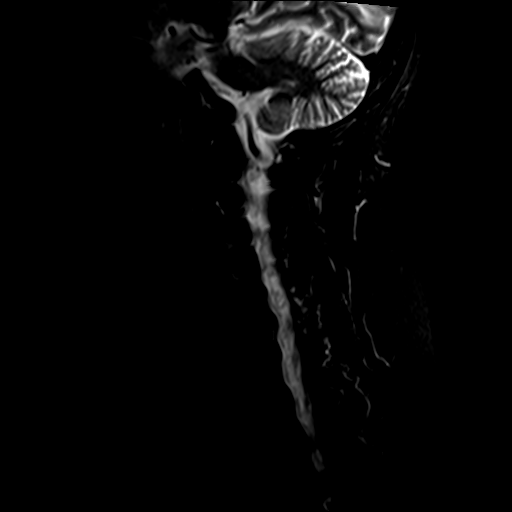
[im 15/18]
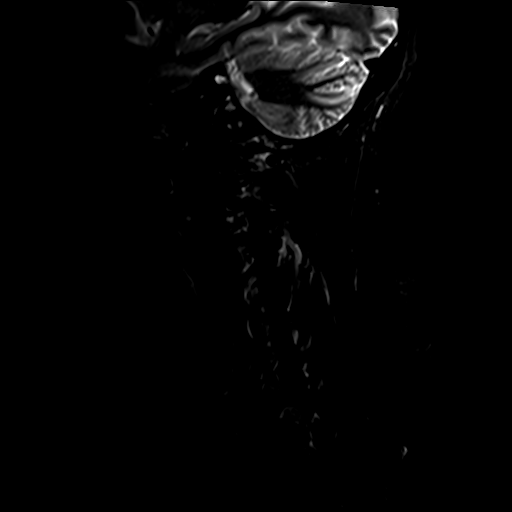

[Series 6: T2 · axial · 3.0mm · 0.70mm/px · z∈[-104,+19]mm · 9 of 33 slices shown (2 of 2)]
[im 1/33]
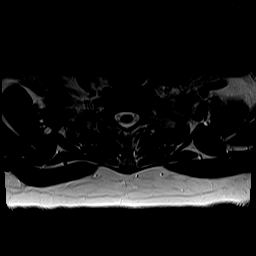
[im 6/33]
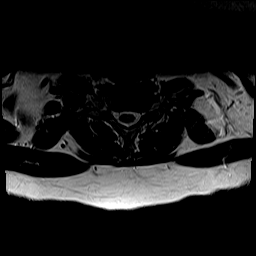
[im 11/33]
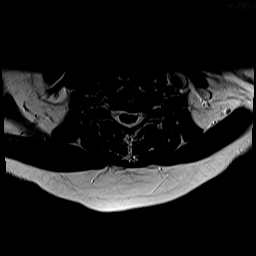
[im 14/33]
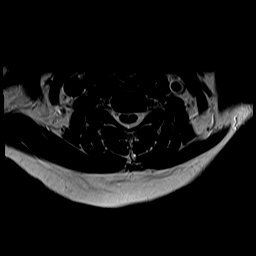
[im 17/33]
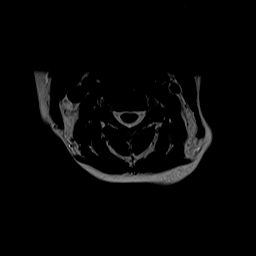
[im 19/33]
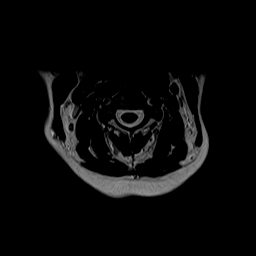
[im 22/33]
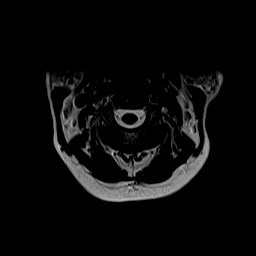
[im 27/33]
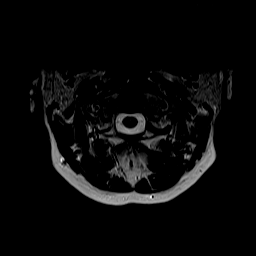
[im 33/33]
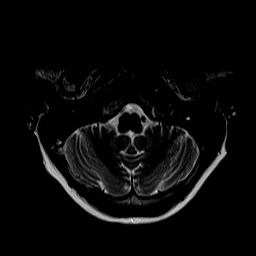

[30 of 48 positions shown; findings below may reference images not displayed]

FINDINGS: Alignment: Straightening of the normal cervical lordosis. No
listhesis.

Vertebrae: Vertebral body height maintained without acute or chronic
fracture. Bone marrow signal intensity within normal limits. No
discrete or worrisome osseous lesions or abnormal marrow edema.

Cord: Normal signal and morphology.

Posterior Fossa, vertebral arteries, paraspinal tissues: Hazy
T2/STIR signal abnormality seen within the pons, likely related to
chronic microvascular ischemic disease. Visualized brain and
posterior fossa otherwise unremarkable. Craniocervical junction
within normal limits. Paraspinous soft tissues within normal limits.
Normal flow voids seen within the vertebral arteries bilaterally.

Disc levels:

C2-C3: Unremarkable.

C3-C4: Tiny central disc protrusion minimally indents the ventral
thecal sac (series 6, image 18). No significant spinal stenosis.
Right greater than left uncovertebral spurring with no more than
mild right C4 foraminal stenosis. Left neural foramina remains
patent.

C4-C5: Mild disc bulge with left-sided uncovertebral spurring. No
significant spinal stenosis. Foramina remain patent.

C5-C6: Left paracentral disc protrusion indents the left ventral
thecal sac, contacting and mildly flattening the left hemi cord
(series 5, image 25). No cord signal changes. Mild spinal stenosis.
Left greater than right uncovertebral spurring with resultant severe
left with mild right C6 foraminal stenosis.

C6-C7: Mild disc bulge with uncovertebral spurring. No spinal
stenosis. Moderate left with mild right C7 foraminal narrowing.

C7-T1: Negative interspace. Mild facet hypertrophy on the right. No
spinal stenosis. Foramina remain patent.

Visualized upper thoracic spine demonstrates mild noncompressive
disc bulging at T1-2 and T2-3 without significant stenosis.
IMPRESSION: 1. Left paracentral disc protrusion at C5-6, contacting and mildly
flattening the left hemi cord and resulting in mild spinal stenosis.
2. Disc bulge with uncovertebral spurring at C6-7 with resultant
moderate left and mild right C7 foraminal stenosis.
3. Additional mild disc bulging with uncovertebral spurring at C3-4
and C4-5 without significant stenosis or impingement.

## 2023-01-13 DIAGNOSIS — R202 Paresthesia of skin: Secondary | ICD-10-CM | POA: Insufficient documentation

## 2023-02-05 IMAGING — DX DG CHEST 2V
2 series · 2 of 2 positions shown · non-contrast
Comparison: No pertinent prior exams available for comparison.

CLINICAL DATA: Provided history: Shortness of breath. Additional
history provided: Left arm pain, shortness of breath, leg swelling,
history of hypertension.

EXAM:
CHEST - 2 VIEW

[chest lat]
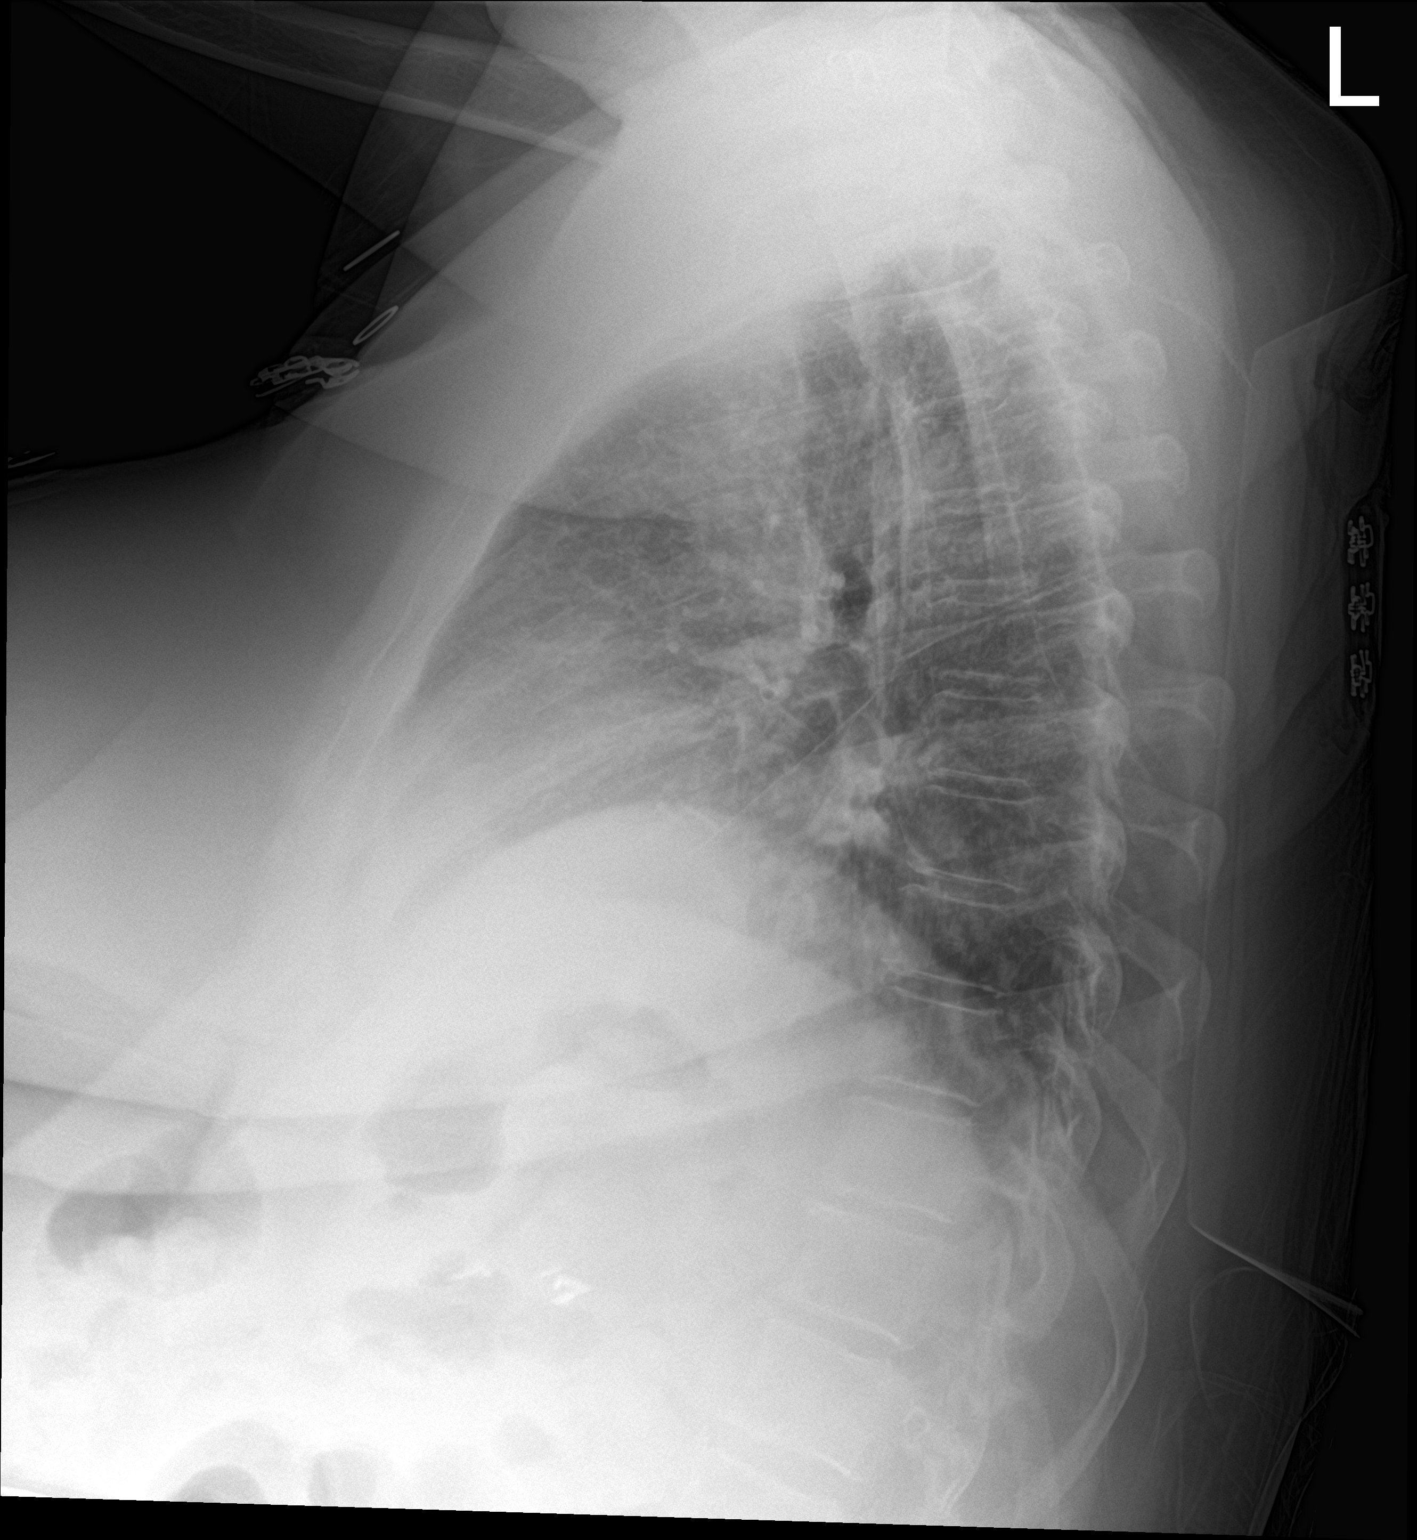

[chest ap]
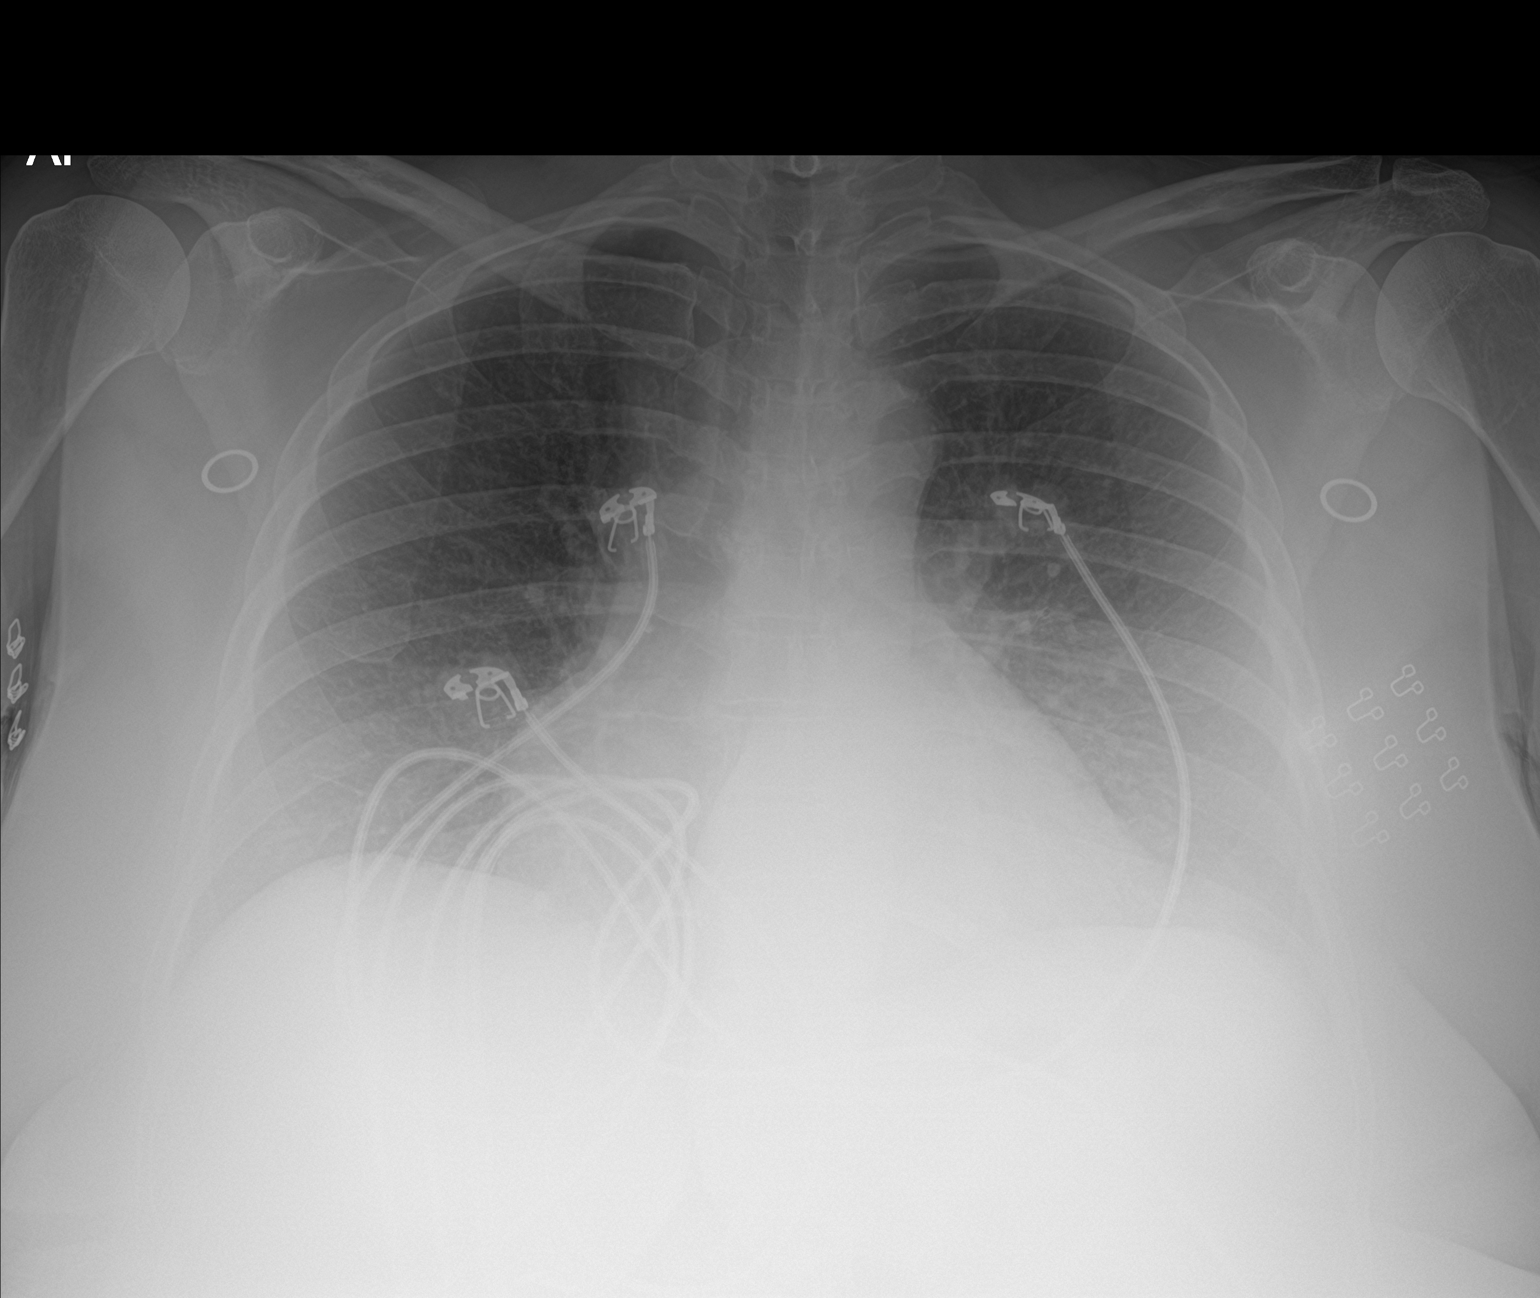

[2 of 2 positions shown; findings below may reference images not displayed]

FINDINGS: Mild cardiomegaly. Central pulmonary vascular congestion with
possible mild interstitial edema. No evidence of pleural effusion or
pneumothorax. No acute bony abnormality identified. Surgical clips
within the upper abdomen.
IMPRESSION: Mild cardiomegaly.

Central pulmonary vascular congestion with possible mild
interstitial edema.

## 2023-02-10 ENCOUNTER — Other Ambulatory Visit: Payer: Self-pay

## 2023-02-10 DIAGNOSIS — R748 Abnormal levels of other serum enzymes: Secondary | ICD-10-CM

## 2023-02-16 ENCOUNTER — Other Ambulatory Visit: Payer: Self-pay | Admitting: Nurse Practitioner

## 2023-02-16 DIAGNOSIS — Z13820 Encounter for screening for osteoporosis: Secondary | ICD-10-CM

## 2023-03-27 ENCOUNTER — Other Ambulatory Visit: Payer: Self-pay | Admitting: Neurological Surgery

## 2023-04-14 ENCOUNTER — Encounter (HOSPITAL_COMMUNITY): Payer: Self-pay

## 2023-04-14 NOTE — Progress Notes (Signed)
Surgical Instructions    Your procedure is scheduled on Tuesday July 30,2024.  Report to [facility] Main Entrance "A" at 10:05 A.M., then check in with the Admitting office.  Call this number if you have problems the morning of surgery:  [phone]   If you have any questions prior to your surgery date call [phone]: Open Monday-Friday 8am-4pm If you experience any cold or flu symptoms such as cough, fever, chills, shortness of breath, etc. between now and your scheduled surgery, please notify us at the above number     Remember:  Do not eat after midnight the night before your surgery.  You may drink clear liquids until 9:05 the morning of your surgery.   Clear liquids allowed are: Water, Non-Citrus Juices (without pulp), Carbonated Beverages, Clear Tea, Black Coffee ONLY (NO MILK, CREAM OR POWDERED CREAMER of any kind), and Gatorade.    Take these medicines the morning of surgery with A SIP OF WATER:  gabapentin (NEURONTIN)  busPIRone (BUSPAR)  diazepam (VALIUM)  FLUoxetine (PROZAC)   If needed:  albuterol nebulizer  oxyCODONE-acetaminophen (PERCOCET)  ANORO ELLIPTA Inhaler: please bring with you the day of surgery.  As of today, STOP taking any Aspirin (unless otherwise instructed by your surgeon) Aleve, Naproxen, Ibuprofen, Motrin, Advil, Goody's, BC's, all herbal medications, fish oil, and all vitamins.  Special instructions:    Oral Hygiene is also important to reduce your risk of infection.  Remember - BRUSH YOUR TEETH THE MORNING OF SURGERY WITH YOUR REGULAR TOOTHPASTE     Pre-operative 5 CHG Bath Instructions   You can play a key role in reducing the risk of infection after surgery. Your skin needs to be as free of germs as possible. You can reduce the number of germs on your skin by washing with CHG (chlorhexidine gluconate) soap before surgery. CHG is an antiseptic soap that kills germs and continues to kill germs even after washing.   DO NOT use if you have  an allergy to chlorhexidine/CHG or antibacterial soaps. If your skin becomes reddened or irritated, stop using the CHG and notify one of our RNs at [phone].   Please shower with the CHG soap starting 4 days before surgery using the following schedule:     Please keep in mind the following:  DO NOT shave, including legs and underarms, starting the day of your first shower.   You may shave your face at any point before/day of surgery.  Place clean sheets on your bed the day you start using CHG soap. Use a clean washcloth (not used since being washed) for each shower. DO NOT sleep with pets once you start using the CHG.   CHG Shower Instructions:  If you choose to wash your hair and private area, wash first with your normal shampoo/soap.  After you use shampoo/soap, rinse your hair and body thoroughly to remove shampoo/soap residue.  Turn the water OFF and apply about 3 tablespoons (45 ml) of CHG soap to a CLEAN washcloth.  Apply CHG soap ONLY FROM YOUR NECK DOWN TO YOUR TOES (washing for 3-5 minutes)  DO NOT use CHG soap on face, private areas, open wounds, or sores.  Pay special attention to the area where your surgery is being performed.  If you are having back surgery, having someone wash your back for you may be helpful. Wait 2 minutes after CHG soap is applied, then you may rinse off the CHG soap.  Pat dry with a clean towel  Put on clean  clothes/pajamas   If you choose to wear lotion, please use ONLY the CHG-compatible lotions on the back of this paper.     Additional instructions for the day of surgery: DO NOT APPLY any lotions, deodorants, cologne, or perfumes.   Put on clean/comfortable clothes.  Brush your teeth.  Ask your nurse before applying any prescription medications to the skin.      CHG Compatible Lotions   Aveeno Moisturizing lotion  Cetaphil Moisturizing Cream  Cetaphil Moisturizing Lotion  Clairol Herbal Essence Moisturizing Lotion, Dry Skin  Clairol  Herbal Essence Moisturizing Lotion, Extra Dry Skin  Clairol Herbal Essence Moisturizing Lotion, Normal Skin  Curel Age Defying Therapeutic Moisturizing Lotion with Alpha Hydroxy  Curel Extreme Care Body Lotion  Curel Soothing Hands Moisturizing Hand Lotion  Curel Therapeutic Moisturizing Cream, Fragrance-Free  Curel Therapeutic Moisturizing Lotion, Fragrance-Free  Curel Therapeutic Moisturizing Lotion, Original Formula  Eucerin Daily Replenishing Lotion  Eucerin Dry Skin Therapy Plus Alpha Hydroxy Crme  Eucerin Dry Skin Therapy Plus Alpha Hydroxy Lotion  Eucerin Original Crme  Eucerin Original Lotion  Eucerin Plus Crme Eucerin Plus Lotion  Eucerin TriLipid Replenishing Lotion  Keri Anti-Bacterial Hand Lotion  Keri Deep Conditioning Original Lotion Dry Skin Formula Softly Scented  Keri Deep Conditioning Original Lotion, Fragrance Free Sensitive Skin Formula  Keri Lotion Fast Absorbing Fragrance Free Sensitive Skin Formula  Keri Lotion Fast Absorbing Softly Scented Dry Skin Formula  Keri Original Lotion  Keri Skin Renewal Lotion Keri Silky Smooth Lotion  Keri Silky Smooth Sensitive Skin Lotion  Nivea Body Creamy Conditioning Oil  Nivea Body Extra Enriched Lotion  Nivea Body Original Lotion  Nivea Body Sheer Moisturizing Lotion Nivea Crme  Nivea Skin Firming Lotion  NutraDerm 30 Skin Lotion  NutraDerm Skin Lotion  NutraDerm Therapeutic Skin Cream  NutraDerm Therapeutic Skin Lotion  ProShield Protective Hand Cream  Provon moisturizing lotion   Day of Surgery:  Take a shower with CHG soap. Wear Clean/Comfortable clothing the morning of surgery Do not apply any deodorants/lotions.   Remember to brush your teeth WITH YOUR REGULAR TOOTHPASTE.  [facility] is not responsible for any belongings or valuables.    Do NOT Smoke (Tobacco/Vaping)  24 hours prior to your procedure  If you use a CPAP at night, you may bring your mask for your overnight stay.   Contacts, glasses,  hearing aids, dentures or partials may not be worn into surgery, please bring cases for these belongings   For patients admitted to the hospital, discharge time will be determined by your treatment team.   Patients discharged the day of surgery will not be allowed to drive home, and someone needs to stay with them for 24 hours.   SURGICAL WAITING ROOM VISITATION Patients having surgery or a procedure may have no more than 2 support people in the waiting area - these visitors may rotate.   Children under the age of 84 must have an adult with them who is not the patient. If the patient needs to stay at the hospital during part of their recovery, the visitor guidelines for inpatient rooms apply. Pre-op nurse will coordinate an appropriate time for 1 support person to accompany patient in pre-op.  This support person may not rotate.   Please refer to [email or web address] for the visitor guidelines for Inpatients (after your surgery is over and you are in a regular room).   If you received a COVID test during your pre-op visit, it is requested that you wear a mask when out in  public, stay away from anyone that may not be feeling well, and notify your surgeon if you develop symptoms. If you have been in contact with anyone that has tested positive in the last 10 days, please notify your surgeon.    Please read over the following fact sheets that you were given.

## 2023-04-14 NOTE — Progress Notes (Signed)
PCP - Sherrie Mustache, PA-C  Cardiologist - n/a  Chest x-ray - 01/25/22 (2V) EKG - 04/17/23 Stress Test - n/a ECHO - 02/24/22 Cardiac Cath - n/a  ICD Pacemaker/Loop - n/a  Sleep Study -  n/a CPAP - none  Diabetes Type - n/a  ERAS: Clear liquids til 9 AM DOS.  Anesthesia review: ***  STOP now taking any Aspirin (unless otherwise instructed by your surgeon), Aleve, Naproxen, Ibuprofen, Motrin, Advil, Goody's, BC's, all herbal medications, fish oil, and all vitamins.   Coronavirus Screening Do you have any of the following symptoms:  Cough yes/no: No Fever (>100.52F)  yes/no: No Runny nose yes/no: No Sore throat yes/no: No Difficulty breathing/shortness of breath  Yes  Have you traveled in the last 14 days and where? yes/no: No  Patient verbalized understanding of instructions that were given to them at the PAT appointment. Patient was also instructed that they will need to review over the PAT instructions again at home before surgery.

## 2023-04-17 ENCOUNTER — Other Ambulatory Visit: Payer: Self-pay

## 2023-04-17 ENCOUNTER — Encounter (HOSPITAL_COMMUNITY): Payer: Self-pay

## 2023-04-17 ENCOUNTER — Encounter (HOSPITAL_COMMUNITY)
Admission: RE | Admit: 2023-04-17 | Discharge: 2023-04-17 | Disposition: A | Discharge status: Home / Self Care | Payer: Medicaid Other | Source: Ambulatory Visit | Attending: Neurological Surgery | Admitting: Neurological Surgery

## 2023-04-17 VITALS — BP 156/100 | HR 70 | Temp 98.4°F | Resp 17 | Ht 61.0 in | Wt 179.1 lb

## 2023-04-17 DIAGNOSIS — M5116 Intervertebral disc disorders with radiculopathy, lumbar region: Secondary | ICD-10-CM | POA: Diagnosis not present

## 2023-04-17 DIAGNOSIS — F909 Attention-deficit hyperactivity disorder, unspecified type: Secondary | ICD-10-CM | POA: Insufficient documentation

## 2023-04-17 DIAGNOSIS — I10 Essential (primary) hypertension: Secondary | ICD-10-CM | POA: Insufficient documentation

## 2023-04-17 DIAGNOSIS — F4310 Post-traumatic stress disorder, unspecified: Secondary | ICD-10-CM | POA: Diagnosis not present

## 2023-04-17 DIAGNOSIS — Z87442 Personal history of urinary calculi: Secondary | ICD-10-CM | POA: Insufficient documentation

## 2023-04-17 DIAGNOSIS — Z01818 Encounter for other preprocedural examination: Secondary | ICD-10-CM | POA: Insufficient documentation

## 2023-04-17 DIAGNOSIS — E119 Type 2 diabetes mellitus without complications: Secondary | ICD-10-CM | POA: Diagnosis not present

## 2023-04-17 HISTORY — DX: Cardiac murmur, unspecified: R01.1

## 2023-04-17 HISTORY — DX: Dyspnea, unspecified: R06.00

## 2023-04-17 HISTORY — DX: Chronic obstructive pulmonary disease, unspecified: J44.9

## 2023-04-17 HISTORY — DX: Dependence on other enabling machines and devices: Z99.89

## 2023-04-17 HISTORY — DX: Anemia, unspecified: D64.9

## 2023-04-17 HISTORY — DX: Post-traumatic stress disorder, unspecified: F43.10

## 2023-04-17 HISTORY — DX: Personal history of urinary calculi: Z87.442

## 2023-04-17 HISTORY — DX: Herpesviral infection, unspecified: B00.9

## 2023-04-17 HISTORY — DX: Unspecified asthma, uncomplicated: J45.909

## 2023-04-17 HISTORY — DX: Type 2 diabetes mellitus without complications: E11.9

## 2023-04-17 LAB — BASIC METABOLIC PANEL
Anion gap: 7 (ref 5–15)
BUN: 15 mg/dL (ref 6–20)
CO2: 24 mmol/L (ref 22–32)
Calcium: 8.8 mg/dL — ABNORMAL LOW (ref 8.9–10.3)
Chloride: 106 mmol/L (ref 98–111)
Creatinine, Ser: 0.8 mg/dL (ref 0.44–1.00)
GFR, Estimated: >60 mL/min (ref >60)
Glucose, Bld: 92 mg/dL (ref 70–99)
Potassium: 3.4 mmol/L — ABNORMAL LOW (ref 3.5–5.1)
Sodium: 137 mmol/L (ref 135–145)

## 2023-04-17 LAB — CBC
HCT: 33.7 % — ABNORMAL LOW (ref 36.0–46.0)
Hemoglobin: 11.1 g/dL — ABNORMAL LOW (ref 12.0–15.0)
MCH: 31.9 pg (ref 26.0–34.0)
MCHC: 32.9 g/dL (ref 30.0–36.0)
MCV: 96.8 fL (ref 80.0–100.0)
Platelets: 210 10*3/uL (ref 150–400)
RBC: 3.48 MIL/uL — ABNORMAL LOW (ref 3.87–5.11)
RDW: 14.8 % (ref 11.5–15.5)
WBC: 9.8 10*3/uL (ref 4.0–10.5)
nRBC: 0 % (ref 0.0–0.2)

## 2023-04-17 LAB — TYPE AND SCREEN
PT AG Type: NEGATIVE
Unit division: 0

## 2023-04-17 LAB — SURGICAL PCR SCREEN
MRSA, PCR: NEGATIVE
Staphylococcus aureus: NEGATIVE

## 2023-04-17 LAB — BPAM RBC
Blood Product Expiration Date: 202408222359
Unit Type and Rh: 600
Unit Type and Rh: 600

## 2023-04-17 NOTE — Progress Notes (Signed)
Surgical Instructions    Your procedure is scheduled on Tuesday July 30,2024.  Report to [facility] Main Entrance "A" at 10:05 A.M., then check in with the Admitting office.  Call this number if you have problems the morning of surgery:  [phone]   If you have any questions prior to your surgery date call [phone]: Open Monday-Friday 8am-4pm If you experience any cold or flu symptoms such as cough, fever, chills, shortness of breath, etc. between now and your scheduled surgery, please notify us at the above number     Remember:  Do not eat after midnight the night before your surgery.  You may drink clear liquids until 9:05 the morning of your surgery.   Clear liquids allowed are: Water, Non-Citrus Juices (without pulp), Carbonated Beverages, Clear Tea, Black Coffee ONLY (NO MILK, CREAM OR POWDERED CREAMER of any kind), and Gatorade.  Please complete your PRE-SURGERY ENSURE that was provided to you by 9:05 AM, the morning of surgery.  Please, if able, drink it in one setting. DO NOT SIP.    Take these medicines the morning of surgery with A SIP OF WATER:  gabapentin (NEURONTIN)  busPIRone (BUSPAR)  diazepam (VALIUM)  FLUoxetine (PROZAC)   If needed:  albuterol nebulizer  oxyCODONE-acetaminophen (PERCOCET)  ANORO ELLIPTA Inhaler: please bring with you the day of surgery.  As of today, STOP taking any Aspirin (unless otherwise instructed by your surgeon) Aleve, Naproxen, Ibuprofen, Motrin, Advil, Goody's, BC's, all herbal medications, fish oil, and all vitamins.  Special instructions:    Oral Hygiene is also important to reduce your risk of infection.  Remember - BRUSH YOUR TEETH THE MORNING OF SURGERY WITH YOUR REGULAR TOOTHPASTE     Pre-operative 5 CHG Bath Instructions   You can play a key role in reducing the risk of infection after surgery. Your skin needs to be as free of germs as possible. You can reduce the number of germs on your skin by washing with CHG  (chlorhexidine gluconate) soap before surgery. CHG is an antiseptic soap that kills germs and continues to kill germs even after washing.   DO NOT use if you have an allergy to chlorhexidine/CHG or antibacterial soaps. If your skin becomes reddened or irritated, stop using the CHG and notify one of our RNs at [phone].   Please shower with the CHG soap starting 4 days before surgery using the following schedule:     Please keep in mind the following:  DO NOT shave, including legs and underarms, starting the day of your first shower.   You may shave your face at any point before/day of surgery.  Place clean sheets on your bed the day you start using CHG soap. Use a clean washcloth (not used since being washed) for each shower. DO NOT sleep with pets once you start using the CHG.   CHG Shower Instructions:  If you choose to wash your hair and private area, wash first with your normal shampoo/soap.  After you use shampoo/soap, rinse your hair and body thoroughly to remove shampoo/soap residue.  Turn the water OFF and apply about 3 tablespoons (45 ml) of CHG soap to a CLEAN washcloth.  Apply CHG soap ONLY FROM YOUR NECK DOWN TO YOUR TOES (washing for 3-5 minutes)  DO NOT use CHG soap on face, private areas, open wounds, or sores.  Pay special attention to the area where your surgery is being performed.  If you are having back surgery, having someone wash your back for you  may be helpful. Wait 2 minutes after CHG soap is applied, then you may rinse off the CHG soap.  Pat dry with a clean towel  Put on clean clothes/pajamas   If you choose to wear lotion, please use ONLY the CHG-compatible lotions on the back of this paper.     Additional instructions for the day of surgery: DO NOT APPLY any lotions, deodorants, cologne, or perfumes.   Put on clean/comfortable clothes.  Brush your teeth.  Ask your nurse before applying any prescription medications to the skin.      CHG Compatible  Lotions   Aveeno Moisturizing lotion  Cetaphil Moisturizing Cream  Cetaphil Moisturizing Lotion  Clairol Herbal Essence Moisturizing Lotion, Dry Skin  Clairol Herbal Essence Moisturizing Lotion, Extra Dry Skin  Clairol Herbal Essence Moisturizing Lotion, Normal Skin  Curel Age Defying Therapeutic Moisturizing Lotion with Alpha Hydroxy  Curel Extreme Care Body Lotion  Curel Soothing Hands Moisturizing Hand Lotion  Curel Therapeutic Moisturizing Cream, Fragrance-Free  Curel Therapeutic Moisturizing Lotion, Fragrance-Free  Curel Therapeutic Moisturizing Lotion, Original Formula  Eucerin Daily Replenishing Lotion  Eucerin Dry Skin Therapy Plus Alpha Hydroxy Crme  Eucerin Dry Skin Therapy Plus Alpha Hydroxy Lotion  Eucerin Original Crme  Eucerin Original Lotion  Eucerin Plus Crme Eucerin Plus Lotion  Eucerin TriLipid Replenishing Lotion  Keri Anti-Bacterial Hand Lotion  Keri Deep Conditioning Original Lotion Dry Skin Formula Softly Scented  Keri Deep Conditioning Original Lotion, Fragrance Free Sensitive Skin Formula  Keri Lotion Fast Absorbing Fragrance Free Sensitive Skin Formula  Keri Lotion Fast Absorbing Softly Scented Dry Skin Formula  Keri Original Lotion  Keri Skin Renewal Lotion Keri Silky Smooth Lotion  Keri Silky Smooth Sensitive Skin Lotion  Nivea Body Creamy Conditioning Oil  Nivea Body Extra Enriched Lotion  Nivea Body Original Lotion  Nivea Body Sheer Moisturizing Lotion Nivea Crme  Nivea Skin Firming Lotion  NutraDerm 30 Skin Lotion  NutraDerm Skin Lotion  NutraDerm Therapeutic Skin Cream  NutraDerm Therapeutic Skin Lotion  ProShield Protective Hand Cream  Provon moisturizing lotion   Day of Surgery:  Take a shower with CHG soap. Wear Clean/Comfortable clothing the morning of surgery Do not apply any deodorants/lotions.   Remember to brush your teeth WITH YOUR REGULAR TOOTHPASTE.  [facility] is not responsible for any belongings or valuables.    Do  NOT Smoke (Tobacco/Vaping)  24 hours prior to your procedure  If you use a CPAP at night, you may bring your mask for your overnight stay.   Contacts, glasses, hearing aids, dentures or partials may not be worn into surgery, please bring cases for these belongings   For patients admitted to the hospital, discharge time will be determined by your treatment team.   Patients discharged the day of surgery will not be allowed to drive home, and someone needs to stay with them for 24 hours.   SURGICAL WAITING ROOM VISITATION Patients having surgery or a procedure may have no more than 2 support people in the waiting area - these visitors may rotate.   Children under the age of 60 must have an adult with them who is not the patient. If the patient needs to stay at the hospital during part of their recovery, the visitor guidelines for inpatient rooms apply. Pre-op nurse will coordinate an appropriate time for 1 support person to accompany patient in pre-op.  This support person may not rotate.   Please refer to [email or web address] for the visitor guidelines for Inpatients (after your surgery is over  and you are in a regular room).   If you received a COVID test during your pre-op visit, it is requested that you wear a mask when out in public, stay away from anyone that may not be feeling well, and notify your surgeon if you develop symptoms. If you have been in contact with anyone that has tested positive in the last 10 days, please notify your surgeon.    Please read over the following fact sheets that you were given.

## 2023-04-18 LAB — TYPE AND SCREEN
ABO/RH(D): A POS
Antibody Screen: POSITIVE
Donor AG Type: NEGATIVE

## 2023-04-18 LAB — BPAM RBC: Blood Product Expiration Date: 202408202359
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
## 2023-04-18 NOTE — Anesthesia Preprocedure Evaluation (Addendum)
Anesthesia Evaluation  Patient identified by MRN, date of birth, ID band Patient awake    Reviewed: Allergy & Precautions, H&P , NPO status , Patient's Chart, lab work & pertinent test results  Airway Mallampati: II  TM Distance: >3 FB Neck ROM: Full    Dental no notable dental hx. (+) Edentulous Upper, Partial Lower, Dental Advisory Given   Pulmonary shortness of breath, asthma , COPD,  COPD inhaler, Current Smoker   Pulmonary exam normal breath sounds clear to auscultation       Cardiovascular hypertension, Pt. on medications + DOE   Rhythm:Regular Rate:Normal     Neuro/Psych   Anxiety Depression    negative neurological ROS     GI/Hepatic negative GI ROS, Neg liver ROS,,,  Endo/Other  diabetes, Type 2, Oral Hypoglycemic Agents    Renal/GU negative Renal ROS  negative genitourinary   Musculoskeletal   Abdominal   Peds  (+) ADHD Hematology  (+) Blood dyscrasia, anemia   Anesthesia Other Findings   Reproductive/Obstetrics negative OB ROS                             Anesthesia Physical Anesthesia Plan  ASA: 3  Anesthesia Plan: General   Post-op Pain Management: Tylenol PO (pre-op)*   Induction: Intravenous  PONV Risk Score and Plan: 3 and Ondansetron, Dexamethasone and Midazolam  Airway Management Planned: Oral ETT  Additional Equipment:   Intra-op Plan:   Post-operative Plan: Extubation in OR  Informed Consent: I have reviewed the patients History and Physical, chart, labs and discussed the procedure including the risks, benefits and alternatives for the proposed anesthesia with the patient or authorized representative who has indicated his/her understanding and acceptance.     Dental advisory given  Plan Discussed with: CRNA  Anesthesia Plan Comments: (PAT note written 04/18/2023 by Shonna Chock, PA-C.  )       Anesthesia Quick Evaluation

## 2023-04-24 NOTE — Progress Notes (Signed)
Pt made aware of surgery time change [phone], arrival 1130, and to stop eating solid food by midnight and to drink the G2 drinka nd all clear liquids by 1030.

## 2023-04-25 ENCOUNTER — Encounter (HOSPITAL_COMMUNITY)
Admission: AD | Disposition: A | Discharge status: Home / Self Care | Payer: Self-pay | Source: Home / Self Care | Attending: Neurological Surgery

## 2023-04-25 ENCOUNTER — Ambulatory Visit (HOSPITAL_COMMUNITY): Payer: Medicaid Other

## 2023-04-25 ENCOUNTER — Observation Stay (HOSPITAL_COMMUNITY)
Admission: AD | Admit: 2023-04-25 | Discharge: 2023-04-26 | Disposition: A | Discharge status: Home / Self Care | Payer: Medicaid Other | Attending: Neurological Surgery | Admitting: Neurological Surgery

## 2023-04-25 ENCOUNTER — Other Ambulatory Visit: Payer: Self-pay

## 2023-04-25 ENCOUNTER — Encounter (HOSPITAL_COMMUNITY): Payer: Self-pay | Admitting: Neurological Surgery

## 2023-04-25 ENCOUNTER — Ambulatory Visit (HOSPITAL_COMMUNITY): Payer: Medicaid Other | Admitting: Vascular Surgery

## 2023-04-25 ENCOUNTER — Ambulatory Visit (HOSPITAL_COMMUNITY): Payer: Medicaid Other | Admitting: Registered Nurse

## 2023-04-25 DIAGNOSIS — M4317 Spondylolisthesis, lumbosacral region: Principal | ICD-10-CM | POA: Insufficient documentation

## 2023-04-25 DIAGNOSIS — J45909 Unspecified asthma, uncomplicated: Secondary | ICD-10-CM | POA: Diagnosis not present

## 2023-04-25 DIAGNOSIS — I10 Essential (primary) hypertension: Secondary | ICD-10-CM

## 2023-04-25 DIAGNOSIS — F17210 Nicotine dependence, cigarettes, uncomplicated: Secondary | ICD-10-CM | POA: Diagnosis not present

## 2023-04-25 DIAGNOSIS — Z01818 Encounter for other preprocedural examination: Secondary | ICD-10-CM

## 2023-04-25 DIAGNOSIS — M4316 Spondylolisthesis, lumbar region: Principal | ICD-10-CM

## 2023-04-25 DIAGNOSIS — E119 Type 2 diabetes mellitus without complications: Secondary | ICD-10-CM | POA: Insufficient documentation

## 2023-04-25 DIAGNOSIS — M960 Pseudarthrosis after fusion or arthrodesis: Secondary | ICD-10-CM | POA: Diagnosis not present

## 2023-04-25 DIAGNOSIS — J449 Chronic obstructive pulmonary disease, unspecified: Secondary | ICD-10-CM | POA: Diagnosis not present

## 2023-04-25 HISTORY — PX: TRANSFORAMINAL LUMBAR INTERBODY FUSION (TLIF) WITH PEDICLE SCREW FIXATION 1 LEVEL: SHX6141

## 2023-04-25 LAB — GLUCOSE, CAPILLARY
Glucose-Capillary: 89 mg/dL (ref 70–99)
Glucose-Capillary: 117 mg/dL — ABNORMAL HIGH (ref 70–99)
Glucose-Capillary: 83 mg/dL (ref 70–99)
Glucose-Capillary: 113 mg/dL — ABNORMAL HIGH (ref 70–99)
Glucose-Capillary: 157 mg/dL — ABNORMAL HIGH (ref 70–99)

## 2023-04-25 LAB — TYPE AND SCREEN
ABO/RH(D): A POS
Antibody Screen: POSITIVE

## 2023-04-25 SURGERY — TRANSFORAMINAL LUMBAR INTERBODY FUSION (TLIF) WITH PEDICLE SCREW FIXATION 1 LEVEL
Anesthesia: General | Site: Spine Lumbar

## 2023-04-25 MED ORDER — SODIUM CHLORIDE 0.9% FLUSH: 3.0000 mL | INTRAVENOUS | Status: DC | PRN

## 2023-04-25 MED ORDER — ALBUMIN HUMAN 5 % IV SOLN: INTRAVENOUS | Status: DC | PRN

## 2023-04-25 MED ORDER — ORAL CARE MOUTH RINSE: 15.0000 mL | Freq: Once | OROMUCOSAL | Status: AC

## 2023-04-25 MED ORDER — HYDROMORPHONE HCL 1 MG/ML IJ SOLN
INTRAMUSCULAR | Status: AC
  Filled 2023-04-25: qty 1

## 2023-04-25 MED ORDER — 0.9 % SODIUM CHLORIDE (POUR BTL) OPTIME
TOPICAL | Status: DC | PRN
  Administered 2023-04-25: 1000 mL

## 2023-04-25 MED ORDER — ACETAMINOPHEN 325 MG PO TABS
650.0000 mg | ORAL_TABLET | ORAL | Status: DC | PRN
  Administered 2023-04-26: 650 mg via ORAL
  Filled 2023-04-25: qty 2

## 2023-04-25 MED ORDER — GLYCOPYRROLATE PF 0.2 MG/ML IJ SOSY
PREFILLED_SYRINGE | INTRAMUSCULAR | Status: DC | PRN
  Administered 2023-04-25: .1 mg via INTRAVENOUS

## 2023-04-25 MED ORDER — OXYCODONE HCL 5 MG PO TABS
15.0000 mg | ORAL_TABLET | ORAL | Status: DC | PRN
  Administered 2023-04-26 (×3): 15 mg via ORAL
  Filled 2023-04-25 (×3): qty 3

## 2023-04-25 MED ORDER — EPHEDRINE 5 MG/ML INJ
INTRAVENOUS | Status: AC
  Filled 2023-04-25: qty 5

## 2023-04-25 MED ORDER — PHENOL 1.4 % MT LIQD: 1.0000 | OROMUCOSAL | Status: DC | PRN

## 2023-04-25 MED ORDER — KETAMINE HCL 10 MG/ML IJ SOLN
INTRAMUSCULAR | Status: DC | PRN
  Administered 2023-04-25 (×2): 10 mg via INTRAVENOUS
  Administered 2023-04-25: 30 mg via INTRAVENOUS

## 2023-04-25 MED ORDER — DOCUSATE SODIUM 100 MG PO CAPS
100.0000 mg | ORAL_CAPSULE | Freq: Two times a day (BID) | ORAL | Status: DC
  Administered 2023-04-25: 100 mg via ORAL
  Filled 2023-04-25: qty 1

## 2023-04-25 MED ORDER — KETAMINE HCL 50 MG/5ML IJ SOSY
PREFILLED_SYRINGE | INTRAMUSCULAR | Status: AC
  Filled 2023-04-25: qty 5

## 2023-04-25 MED ORDER — ROCURONIUM BROMIDE 10 MG/ML (PF) SYRINGE
PREFILLED_SYRINGE | INTRAVENOUS | Status: AC
  Filled 2023-04-25: qty 10

## 2023-04-25 MED ORDER — PROPOFOL 10 MG/ML IV BOLUS
INTRAVENOUS | Status: DC | PRN
  Administered 2023-04-25: 130 mg via INTRAVENOUS

## 2023-04-25 MED ORDER — MIDAZOLAM HCL 2 MG/2ML IJ SOLN
INTRAMUSCULAR | Status: AC
  Filled 2023-04-25: qty 2

## 2023-04-25 MED ORDER — POLYETHYLENE GLYCOL 3350 17 G PO PACK: 17.0000 g | PACK | Freq: Every day | ORAL | Status: DC | PRN

## 2023-04-25 MED ORDER — LIDOCAINE-EPINEPHRINE 1 %-1:100000 IJ SOLN
INTRAMUSCULAR | Status: AC
  Filled 2023-04-25: qty 1

## 2023-04-25 MED ORDER — OXYCODONE-ACETAMINOPHEN 10-325 MG PO TABS: 1.0000 | ORAL_TABLET | Freq: Every day | ORAL | Status: DC | PRN

## 2023-04-25 MED ORDER — LACTATED RINGERS IV SOLN: INTRAVENOUS | Status: DC

## 2023-04-25 MED ORDER — THROMBIN 5000 UNITS EX SOLR
OROMUCOSAL | Status: DC | PRN
  Administered 2023-04-25: 5 mL via TOPICAL

## 2023-04-25 MED ORDER — THROMBIN 5000 UNITS EX SOLR
CUTANEOUS | Status: AC
  Filled 2023-04-25: qty 5000

## 2023-04-25 MED ORDER — FENTANYL CITRATE (PF) 250 MCG/5ML IJ SOLN
INTRAMUSCULAR | Status: DC | PRN
  Administered 2023-04-25: 50 ug via INTRAVENOUS
  Administered 2023-04-25: 100 ug via INTRAVENOUS
  Administered 2023-04-25 (×2): 50 ug via INTRAVENOUS

## 2023-04-25 MED ORDER — CHLORHEXIDINE GLUCONATE 0.12 % MT SOLN
15.0000 mL | Freq: Once | OROMUCOSAL | Status: AC
  Administered 2023-04-25: 15 mL via OROMUCOSAL

## 2023-04-25 MED ORDER — MIDAZOLAM HCL 2 MG/2ML IJ SOLN
INTRAMUSCULAR | Status: DC | PRN
  Administered 2023-04-25: 1 mg via INTRAVENOUS

## 2023-04-25 MED ORDER — PHENYLEPHRINE 80 MCG/ML (10ML) SYRINGE FOR IV PUSH (FOR BLOOD PRESSURE SUPPORT)
PREFILLED_SYRINGE | INTRAVENOUS | Status: AC
  Filled 2023-04-25: qty 10

## 2023-04-25 MED ORDER — PHENYLEPHRINE 80 MCG/ML (10ML) SYRINGE FOR IV PUSH (FOR BLOOD PRESSURE SUPPORT)
PREFILLED_SYRINGE | INTRAVENOUS | Status: DC | PRN
  Administered 2023-04-25: 80 ug via INTRAVENOUS
  Administered 2023-04-25: 40 ug via INTRAVENOUS
  Administered 2023-04-25 (×2): 80 ug via INTRAVENOUS

## 2023-04-25 MED ORDER — DEXMEDETOMIDINE HCL IN NACL 80 MCG/20ML IV SOLN
INTRAVENOUS | Status: DC | PRN
  Administered 2023-04-25 (×4): 4 ug via INTRAVENOUS

## 2023-04-25 MED ORDER — ONDANSETRON HCL 4 MG/2ML IJ SOLN
INTRAMUSCULAR | Status: DC | PRN
  Administered 2023-04-25: 4 mg via INTRAVENOUS

## 2023-04-25 MED ORDER — DEXAMETHASONE SODIUM PHOSPHATE 10 MG/ML IJ SOLN
INTRAMUSCULAR | Status: DC | PRN
  Administered 2023-04-25: 10 mg via INTRAVENOUS

## 2023-04-25 MED ORDER — ALBUTEROL SULFATE HFA 108 (90 BASE) MCG/ACT IN AERS: 1.0000 | INHALATION_SPRAY | Freq: Four times a day (QID) | RESPIRATORY_TRACT | Status: DC | PRN

## 2023-04-25 MED ORDER — FLUOXETINE HCL 20 MG PO CAPS: 80.0000 mg | ORAL_CAPSULE | Freq: Every day | ORAL | Status: DC

## 2023-04-25 MED ORDER — FENTANYL CITRATE (PF) 250 MCG/5ML IJ SOLN
INTRAMUSCULAR | Status: AC
  Filled 2023-04-25: qty 5

## 2023-04-25 MED ORDER — ACETAMINOPHEN 500 MG PO TABS
1000.0000 mg | ORAL_TABLET | Freq: Once | ORAL | Status: AC
  Administered 2023-04-25: 1000 mg via ORAL
  Filled 2023-04-25: qty 2

## 2023-04-25 MED ORDER — DIAZEPAM 5 MG PO TABS: 2.5000 mg | ORAL_TABLET | Freq: Three times a day (TID) | ORAL | Status: DC | PRN

## 2023-04-25 MED ORDER — INSULIN ASPART 100 UNIT/ML IJ SOLN: 0.0000 [IU] | Freq: Every day | INTRAMUSCULAR | Status: DC

## 2023-04-25 MED ORDER — AMPHETAMINE-DEXTROAMPHETAMINE 10 MG PO TABS: 15.0000 mg | ORAL_TABLET | Freq: Every day | ORAL | Status: DC

## 2023-04-25 MED ORDER — VANCOMYCIN HCL IN DEXTROSE 1-5 GM/200ML-% IV SOLN
1000.0000 mg | INTRAVENOUS | Status: AC
  Administered 2023-04-25: 1000 mg via INTRAVENOUS
  Filled 2023-04-25: qty 200

## 2023-04-25 MED ORDER — HYDROMORPHONE HCL 1 MG/ML IJ SOLN: 1.0000 mg | INTRAMUSCULAR | Status: DC | PRN

## 2023-04-25 MED ORDER — CHLORHEXIDINE GLUCONATE CLOTH 2 % EX PADS: 6.0000 | MEDICATED_PAD | Freq: Once | CUTANEOUS | Status: DC

## 2023-04-25 MED ORDER — DEXAMETHASONE SODIUM PHOSPHATE 10 MG/ML IJ SOLN
INTRAMUSCULAR | Status: AC
  Filled 2023-04-25: qty 1

## 2023-04-25 MED ORDER — FUROSEMIDE 20 MG PO TABS: 20.0000 mg | ORAL_TABLET | Freq: Every day | ORAL | Status: DC | PRN

## 2023-04-25 MED ORDER — EPHEDRINE SULFATE-NACL 50-0.9 MG/10ML-% IV SOSY: PREFILLED_SYRINGE | INTRAVENOUS | Status: DC | PRN

## 2023-04-25 MED ORDER — FLUTICASONE PROPIONATE 50 MCG/ACT NA SUSP: 1.0000 | Freq: Every day | NASAL | Status: DC | PRN

## 2023-04-25 MED ORDER — ACETAMINOPHEN 325 MG PO TABS
325.0000 mg | ORAL_TABLET | Freq: Every day | ORAL | Status: DC | PRN
  Administered 2023-04-25 – 2023-04-26 (×2): 325 mg via ORAL
  Filled 2023-04-25 (×2): qty 1

## 2023-04-25 MED ORDER — GLYCOPYRROLATE PF 0.2 MG/ML IJ SOSY
PREFILLED_SYRINGE | INTRAMUSCULAR | Status: AC
  Filled 2023-04-25: qty 1

## 2023-04-25 MED ORDER — ROCURONIUM BROMIDE 10 MG/ML (PF) SYRINGE
PREFILLED_SYRINGE | INTRAVENOUS | Status: DC | PRN
  Administered 2023-04-25: 30 mg via INTRAVENOUS
  Administered 2023-04-25: 70 mg via INTRAVENOUS

## 2023-04-25 MED ORDER — LISINOPRIL 20 MG PO TABS
40.0000 mg | ORAL_TABLET | Freq: Every day | ORAL | Status: DC
  Administered 2023-04-25: 40 mg via ORAL
  Filled 2023-04-25 (×2): qty 2

## 2023-04-25 MED ORDER — LIDOCAINE 2% (20 MG/ML) 5 ML SYRINGE
INTRAMUSCULAR | Status: AC
  Filled 2023-04-25: qty 5

## 2023-04-25 MED ORDER — ONDANSETRON HCL 4 MG/2ML IJ SOLN: 4.0000 mg | Freq: Four times a day (QID) | INTRAMUSCULAR | Status: DC | PRN

## 2023-04-25 MED ORDER — OXYCODONE HCL 5 MG PO TABS
10.0000 mg | ORAL_TABLET | Freq: Every day | ORAL | Status: DC | PRN
  Administered 2023-04-25 – 2023-04-26 (×2): 10 mg via ORAL
  Filled 2023-04-25 (×2): qty 2

## 2023-04-25 MED ORDER — ONDANSETRON HCL 4 MG/2ML IJ SOLN
INTRAMUSCULAR | Status: AC
  Filled 2023-04-25: qty 2

## 2023-04-25 MED ORDER — LIDOCAINE-EPINEPHRINE 1 %-1:100000 IJ SOLN
INTRAMUSCULAR | Status: DC | PRN
  Administered 2023-04-25: 10 mL via INTRADERMAL

## 2023-04-25 MED ORDER — INSULIN ASPART 100 UNIT/ML IJ SOLN
0.0000 [IU] | Freq: Three times a day (TID) | INTRAMUSCULAR | Status: DC
  Administered 2023-04-26: 5 [IU] via SUBCUTANEOUS

## 2023-04-25 MED ORDER — BUSPIRONE HCL 15 MG PO TABS
15.0000 mg | ORAL_TABLET | Freq: Three times a day (TID) | ORAL | Status: DC
  Administered 2023-04-25: 15 mg via ORAL
  Filled 2023-04-25: qty 1

## 2023-04-25 MED ORDER — SUGAMMADEX SODIUM 200 MG/2ML IV SOLN
INTRAVENOUS | Status: DC | PRN
  Administered 2023-04-25: 400 mg via INTRAVENOUS

## 2023-04-25 MED ORDER — CHLORHEXIDINE GLUCONATE 0.12 % MT SOLN
OROMUCOSAL | Status: AC
  Filled 2023-04-25: qty 15

## 2023-04-25 MED ORDER — FENTANYL CITRATE (PF) 100 MCG/2ML IJ SOLN
50.0000 ug | Freq: Once | INTRAMUSCULAR | Status: AC
  Administered 2023-04-25: 50 ug via INTRAVENOUS
  Filled 2023-04-25: qty 2

## 2023-04-25 MED ORDER — INSULIN ASPART 100 UNIT/ML IJ SOLN: 0.0000 [IU] | INTRAMUSCULAR | Status: DC | PRN

## 2023-04-25 MED ORDER — SODIUM CHLORIDE 0.9 % IV SOLN: 250.0000 mL | INTRAVENOUS | Status: DC

## 2023-04-25 MED ORDER — METFORMIN HCL 500 MG PO TABS
500.0000 mg | ORAL_TABLET | Freq: Every day | ORAL | Status: DC
  Administered 2023-04-26: 500 mg via ORAL
  Filled 2023-04-25: qty 1

## 2023-04-25 MED ORDER — PHENYLEPHRINE HCL-NACL 20-0.9 MG/250ML-% IV SOLN
INTRAVENOUS | Status: DC | PRN
  Administered 2023-04-25: 20 ug/min via INTRAVENOUS

## 2023-04-25 MED ORDER — ONDANSETRON HCL 4 MG PO TABS: 4.0000 mg | ORAL_TABLET | Freq: Four times a day (QID) | ORAL | Status: DC | PRN

## 2023-04-25 MED ORDER — HYDROMORPHONE HCL 1 MG/ML IJ SOLN
0.2500 mg | INTRAMUSCULAR | Status: DC | PRN
  Administered 2023-04-25 (×3): 0.5 mg via INTRAVENOUS

## 2023-04-25 MED ORDER — LIDOCAINE 2% (20 MG/ML) 5 ML SYRINGE
INTRAMUSCULAR | Status: DC | PRN
  Administered 2023-04-25: 60 mg via INTRAVENOUS

## 2023-04-25 MED ORDER — SODIUM CHLORIDE 0.9% FLUSH: 3.0000 mL | Freq: Two times a day (BID) | INTRAVENOUS | Status: DC

## 2023-04-25 MED ORDER — ALBUTEROL SULFATE (2.5 MG/3ML) 0.083% IN NEBU: 2.5000 mg | INHALATION_SOLUTION | Freq: Four times a day (QID) | RESPIRATORY_TRACT | Status: DC | PRN

## 2023-04-25 MED ORDER — GABAPENTIN 300 MG PO CAPS
600.0000 mg | ORAL_CAPSULE | Freq: Two times a day (BID) | ORAL | Status: DC
  Administered 2023-04-25: 600 mg via ORAL
  Filled 2023-04-25: qty 2

## 2023-04-25 MED ORDER — ACETAMINOPHEN 650 MG RE SUPP: 650.0000 mg | RECTAL | Status: DC | PRN

## 2023-04-25 MED ORDER — CYCLOBENZAPRINE HCL 10 MG PO TABS
10.0000 mg | ORAL_TABLET | Freq: Three times a day (TID) | ORAL | Status: DC | PRN
  Administered 2023-04-25 – 2023-04-26 (×2): 10 mg via ORAL
  Filled 2023-04-25 (×2): qty 1

## 2023-04-25 MED ORDER — MENTHOL 3 MG MT LOZG: 1.0000 | LOZENGE | OROMUCOSAL | Status: DC | PRN

## 2023-04-25 SURGICAL SUPPLY — 81 items
ADH SKN CLS APL DERMABOND .7 (GAUZE/BANDAGES/DRESSINGS) ×1
APL SKNCLS STERI-STRIP NONHPOA (GAUZE/BANDAGES/DRESSINGS)
BAG COUNTER SPONGE SURGICOUNT (BAG) ×1 IMPLANT
BAG SPNG CNTER NS LX DISP (BAG) ×1
BASKET BONE COLLECTION (BASKET) ×1 IMPLANT
BENZOIN TINCTURE PRP APPL 2/3 (GAUZE/BANDAGES/DRESSINGS) IMPLANT
BLADE CLIPPER SURG (BLADE) IMPLANT
BLADE SURG 11 STRL SS (BLADE) ×1 IMPLANT
BUR 14 MATCH 3 (BUR) IMPLANT
BUR MATCHSTICK NEURO 3.0 LAGG (BURR) ×1 IMPLANT
BUR MR8 14CM BALL SYMTRI 5 (BUR) IMPLANT
BUR PRECISION FLUTE 5.0 (BURR) ×1 IMPLANT
BURR 14 MATCH 3 (BUR)
BURR MR8 14CM BALL SYMTRI 5 (BUR)
CAGE INTERBODY PL SHT 7X22.5 (Plate) IMPLANT
CANISTER SUCT 3000ML PPV (MISCELLANEOUS) ×1 IMPLANT
CNTNR URN SCR LID CUP LEK RST (MISCELLANEOUS) ×1 IMPLANT
CONT SPEC 4OZ STRL OR WHT (MISCELLANEOUS) ×1
COVER BACK TABLE 60X90IN (DRAPES) ×1 IMPLANT
COVERAGE SUPPORT O-ARM STEALTH (MISCELLANEOUS) ×1 IMPLANT
DERMABOND ADVANCED .7 DNX12 (GAUZE/BANDAGES/DRESSINGS) ×1 IMPLANT
DRAPE C-ARM 42X72 X-RAY (DRAPES) IMPLANT
DRAPE C-ARMOR (DRAPES) IMPLANT
DRAPE LAPAROTOMY 100X72X124 (DRAPES) ×1 IMPLANT
DRAPE MICROSCOPE SLANT 54X150 (MISCELLANEOUS) IMPLANT
DRAPE SHEET LG 3/4 BI-LAMINATE (DRAPES) ×4 IMPLANT
DRAPE SURG 17X23 STRL (DRAPES) ×1 IMPLANT
DURAPREP 26ML APPLICATOR (WOUND CARE) ×1 IMPLANT
ELECT REM PT RETURN 9FT ADLT (ELECTROSURGICAL) ×1
ELECTRODE REM PT RTRN 9FT ADLT (ELECTROSURGICAL) ×1 IMPLANT
FEE COVERAGE SUPPORT O-ARM (MISCELLANEOUS) ×1 IMPLANT
GAUZE 4X4 16PLY ~~LOC~~+RFID DBL (SPONGE) IMPLANT
GAUZE SPONGE 4X4 12PLY STRL (GAUZE/BANDAGES/DRESSINGS) IMPLANT
GLOVE BIO SURGEON STRL SZ7.5 (GLOVE) ×2 IMPLANT
GLOVE BIOGEL PI IND STRL 7.0 (GLOVE) ×2 IMPLANT
GLOVE BIOGEL PI IND STRL 7.5 (GLOVE) ×2 IMPLANT
GLOVE ECLIPSE 7.5 STRL STRAW (GLOVE) ×2 IMPLANT
GLOVE EXAM NITRILE LRG STRL (GLOVE) IMPLANT
GLOVE EXAM NITRILE XL STR (GLOVE) IMPLANT
GLOVE EXAM NITRILE XS STR PU (GLOVE) IMPLANT
GOWN STRL REUS W/ TWL LRG LVL3 (GOWN DISPOSABLE) ×4 IMPLANT
GOWN STRL REUS W/ TWL XL LVL3 (GOWN DISPOSABLE) IMPLANT
GOWN STRL REUS W/TWL 2XL LVL3 (GOWN DISPOSABLE) IMPLANT
GOWN STRL REUS W/TWL LRG LVL3 (GOWN DISPOSABLE) ×4
GOWN STRL REUS W/TWL XL LVL3 (GOWN DISPOSABLE)
GRAFT BN 5X1XSPNE CVD POST DBM (Bone Implant) IMPLANT
GRAFT BONE MAGNIFUSE 1X5CM (Bone Implant) ×1 IMPLANT
HEMOSTAT POWDER KIT SURGIFOAM (HEMOSTASIS) ×1 IMPLANT
KIT BASIN OR (CUSTOM PROCEDURE TRAY) ×1 IMPLANT
KIT INFUSE SMALL (Orthopedic Implant) IMPLANT
KIT POSITION SURG JACKSON T1 (MISCELLANEOUS) ×1 IMPLANT
KIT TURNOVER KIT B (KITS) ×1 IMPLANT
MARKER SPHERE PSV REFLC NDI (MISCELLANEOUS) ×5 IMPLANT
MILL BONE PREP (MISCELLANEOUS) IMPLANT
NDL HYPO 18GX1.5 BLUNT FILL (NEEDLE) IMPLANT
NDL HYPO 22X1.5 SAFETY MO (MISCELLANEOUS) ×1 IMPLANT
NDL SPNL 18GX3.5 QUINCKE PK (NEEDLE) IMPLANT
NEEDLE HYPO 18GX1.5 BLUNT FILL (NEEDLE) IMPLANT
NEEDLE HYPO 22X1.5 SAFETY MO (MISCELLANEOUS) ×1 IMPLANT
NEEDLE SPNL 18GX3.5 QUINCKE PK (NEEDLE) IMPLANT
NS IRRIG 1000ML POUR BTL (IV SOLUTION) ×1 IMPLANT
PACK LAMINECTOMY NEURO (CUSTOM PROCEDURE TRAY) ×1 IMPLANT
PAD ARMBOARD 7.5X6 YLW CONV (MISCELLANEOUS) ×3 IMPLANT
ROD PED SOLERA 5.5X60 (Rod) IMPLANT
SCREW MAS/SET HEAD FOR 5.5 ROD (Screw) IMPLANT
SCREW MAS/SET STERILE 4PK (Screw) IMPLANT
SCREW SHANK HORIZ NL 6.5X35 (Screw) IMPLANT
SCREW SHANK NL TITAN 6.5X45 (Screw) IMPLANT
SPIKE FLUID TRANSFER (MISCELLANEOUS) ×1 IMPLANT
SPONGE SURGIFOAM ABS GEL 100 (HEMOSTASIS) IMPLANT
SPONGE T-LAP 4X18 ~~LOC~~+RFID (SPONGE) IMPLANT
STRIP CLOSURE SKIN 1/2X4 (GAUZE/BANDAGES/DRESSINGS) IMPLANT
SUT MNCRL AB 3-0 PS2 18 (SUTURE) ×1 IMPLANT
SUT VIC AB 0 CT1 18XCR BRD8 (SUTURE) ×1 IMPLANT
SUT VIC AB 0 CT1 8-18 (SUTURE) ×1
SUT VIC AB 2-0 CP2 18 (SUTURE) ×1 IMPLANT
SYR 3ML LL SCALE MARK (SYRINGE) IMPLANT
TOWEL GREEN STERILE (TOWEL DISPOSABLE) ×1 IMPLANT
TOWEL GREEN STERILE FF (TOWEL DISPOSABLE) ×1 IMPLANT
TRAY FOLEY MTR SLVR 16FR STAT (SET/KITS/TRAYS/PACK) ×1 IMPLANT
WATER STERILE IRR 1000ML POUR (IV SOLUTION) ×1 IMPLANT
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
## 2023-04-25 NOTE — Anesthesia Postprocedure Evaluation (Signed)
Anesthesia Post Note  Patient: Cyril Loosen  Procedure(s) Performed: Revision of hardware @L4 -5 with extension of fusion to L5-S1 with TLIF/posteriolateral instrumentation (Spine Lumbar) Application of O-Arm (Spine Lumbar)     Patient location during evaluation: PACU Anesthesia Type: General Level of consciousness: awake and alert Pain management: pain level controlled Vital Signs Assessment: post-procedure vital signs reviewed and stable Respiratory status: spontaneous breathing, nonlabored ventilation, respiratory function stable and patient connected to nasal cannula oxygen Cardiovascular status: blood pressure returned to baseline and stable Postop Assessment: no apparent nausea or vomiting Anesthetic complications: no  No notable events documented.  Last Vitals:  Vitals:   04/25/23 1945 04/25/23 2005  BP: 119/80 (!) 136/95  Pulse: 62 65  Resp: 14 20  Temp: 36.6 C 36.8 C  SpO2: 94% 92%    Last Pain:  Vitals:   04/25/23 2005  TempSrc: Oral  PainSc:                  Shelton Silvas

## 2023-04-25 NOTE — OR Nursing (Signed)
Dr. Maurice Small explanted 2 caps, 1 rod, 2 screws from previous surgery.

## 2023-04-25 NOTE — Anesthesia Procedure Notes (Addendum)
Procedure Name: Intubation Date/Time: 04/25/2023 3:43 PM  Performed by: Sharyn Dross, CRNAPre-anesthesia Checklist: Patient identified, Emergency Drugs available, Suction available and Patient being monitored Patient Re-evaluated:Patient Re-evaluated prior to induction Oxygen Delivery Method: Circle system utilized Preoxygenation: Pre-oxygenation with 100% oxygen Induction Type: IV induction and Cricoid Pressure applied Ventilation: Mask ventilation without difficulty and Oral airway inserted - appropriate to patient size Laryngoscope Size: Mac and 3 Grade View: Grade I Tube type: Oral Tube size: 7.0 mm Number of attempts: 1 Airway Equipment and Method: Stylet and Oral airway Placement Confirmation: ETT inserted through vocal cords under direct vision, positive ETCO2 and breath sounds checked- equal and bilateral Secured at: 18 cm Tube secured with: Tape Dental Injury: Teeth and Oropharynx as per pre-operative assessment

## 2023-04-25 NOTE — Progress Notes (Signed)
Dr. Aleene Davidson made aware that patient took Metformin this morning at 0730 and today's CBG is 89. Dr. Sampson Goon also made aware that the patient has gel coated nail tips and the tech had trouble picking up the patient's O2 sat this morning. The nurse tech was eventually able to get a O2 sat reading. No new orders received.

## 2023-04-25 NOTE — Op Note (Signed)
PATIENT: Kelli Juarez  DAY OF SURGERY: 04/25/23   PRE-OPERATIVE DIAGNOSIS:  Lumbar spondylolisthesis, lumbar pseudoarthrosis   POST-OPERATIVE DIAGNOSIS:  Same   PROCEDURE:  Removal of L4, L5 pedicle screws, revision of L4-5 pseudoarthrosis, L4-S1 posterolateral instrumented fusion with placement of new bilateral L4, L5, and S1 pedicle screws, left open L5-S1 transforaminal lumbar interbody fusion, use of intraoperative CT scan with frameless stereotactic navigation   SURGEON:  Surgeon(s) and Role:    Jadene Pierini, MD    Emilee Hero PA   ANESTHESIA: ETGA   BRIEF HISTORY: This is a 52 year old woman who previously had an L4-5 fusion with another surgeon that presented with a pseudoarthrosis at that level with bilateral pars defects and spondylolisthesis as well as an isthmic spondy at L5-S1. I therefore recommended revision with extension across the unstable segment below. This was discussed with the patient as well as risks, benefits, and alternatives and wished to proceed with surgery.   OPERATIVE DETAIL: The patient was taken to the operating room and anesthesia was induced by the anesthesia team. They were placed on the OR table in the prone position with padding of all pressure points. A formal time out was performed with two patient identifiers and confirmed the operative site. The operative site was marked, hair was clipped with surgical clippers, the area was then prepped and draped in a sterile fashion. The patient's prior midline incision was used to expose from  to L4-S1. The unilateral L4 and L5 hardware was exposed and removed.  A spinous process clamp was applied and secured, followed by attachment of a reference array. The field was draped and the O-arm was brought into the field. An intra-op CT was performed, sent to the Stealth navigation station, registered to the patient's anatomy, and confirmed with landmarks with acceptable fit. Stereotactic spinal navigation  was utilized throughout the procedure for planning and placement of pedicle screw trajectories.  Instrumentation was then performed. Frameless stereotaxy was used to guide placement of bilateral pedicle screws (Medtronic) bilaterally at L4, L5, and S1. At L4 and L5 on the right, the trajectories were acceptable with higher diameter, so a navigated tap was used to widen these trajectories for a larger diameter screw. The pedicle screws were otherwise placed with navigated instruments by localizing the pedicle, drilling a pilot hole, cannulating the pedicle with an awl-tap, palpating for pedicle wall breaches, and then placing the screw.   A left L5-S1 TLIF was then performed by a performing a complete facetectomy at L5-S1, identifying, decompressing, and protecting the exiting root, performing a discectomy, preparing the endplates, placing autograft into the disc space along with rBMP, and placing a titanium expandable interbody using a navigated inserter. The interbody was expanded, packed with bone through the inserter, and released.  The pedicle screws were then connected with rods bilaterally and final tightened according to manufacturer torque specifications. The bone was thoroughly decorticated over the fusion surface and the previously resected bone fragments were morselized and used as autograft with the addition of Magnafuse allograft with the remainder of the rBMP (Medtronic).   Alli Consentino PA was scrubbed and assisted with the entire procedure which included exposure, instrumentation, and closure.  All instrument and sponge counts were correct, the incision was then closed in layers. The patient was then returned to anesthesia for emergence. No apparent complications at the completion of the procedure.   EBL:    DRAINS: none   SPECIMENS: none   Jadene Pierini, MD

## 2023-04-25 NOTE — Transfer of Care (Signed)
Immediate Anesthesia Transfer of Care Note  Patient: Kelli Juarez  Procedure(s) Performed: Revision of hardware @L4 -5 with extension of fusion to L5-S1 with TLIF/posteriolateral instrumentation (Spine Lumbar) Application of O-Arm (Spine Lumbar)  Patient Location: PACU  Anesthesia Type:General  Level of Consciousness: drowsy and patient cooperative  Airway & Oxygen Therapy: Patient connected to face mask oxygen  Post-op Assessment: Report given to RN, Post -op Vital signs reviewed and stable, and Patient moving all extremities X 4  Post vital signs: Reviewed and stable  Last Vitals:  Vitals Value Taken Time  BP 151/85   Temp    Pulse 95   Resp 12   SpO2 99     Last Pain:  Vitals:   04/25/23 1110  TempSrc:   PainSc: 9       Patients Stated Pain Goal: 1 (04/25/23 1110)  Complications: No notable events documented.

## 2023-04-26 DIAGNOSIS — M4317 Spondylolisthesis, lumbosacral region: Secondary | ICD-10-CM | POA: Diagnosis not present

## 2023-04-26 LAB — GLUCOSE, CAPILLARY: Glucose-Capillary: 201 mg/dL — ABNORMAL HIGH (ref 70–99)

## 2023-04-26 MED ORDER — CYCLOBENZAPRINE HCL 10 MG PO TABS: 10.0000 mg | ORAL_TABLET | Freq: Three times a day (TID) | ORAL | 0 refills | Status: AC | PRN

## 2023-04-26 MED ORDER — OXYCODONE HCL 5 MG PO CAPS: 5.0000 mg | ORAL_CAPSULE | ORAL | 0 refills | Status: DC | PRN

## 2023-04-26 NOTE — Discharge Summary (Signed)
Discharge Summary  Date of Admission: 04/25/2023  Date of Discharge: 04/26/23  Attending Physician: Autumn Patty, MD  Hospital Course: Patient was admitted following an uncomplicated revision L4-S1 TLIF / PSF. They were recovered in PACU and transferred to [facility]. Their preop symptoms were improved, their hospital course was uncomplicated and the patient was discharged home today. They will follow up in clinic with me in clinic in 2 weeks.  Neurologic exam at discharge:  Strength 5/5 x4 and SILTx4, incision c/d/I   Discharge diagnosis: Lumbar spondylolisthesis, lumbar pseudoarthrosis   Iran Sizer, PA-C 04/26/23 8:58 AM

## 2023-04-26 NOTE — Evaluation (Signed)
Physical Therapy Evaluation  Patient Details Name: Kelli Juarez MRN: [id number] DOB: [birth year] Today's Date: 04/26/2023  History of Present Illness  Pt is a 52 yo female s/p removal of L4, L5 pedicle screws, revision of L4-5 pseudoarthrosis, L4-S1 posterolateral instrumented fusion with placement of new bilateral L4, L5, and S1 pedicle screws, left open L5-S1 transforaminal lumbar interbody fusion due to right sacral numbness and back pain, workup showed a pseudoarthrosis at 4-5 with 5-1 pars defects as well.   Clinical Impression  Pt admitted with above diagnosis. At the time of PT eval, pt was able to demonstrate transfers and ambulation with gross min guard assist. Tolerance for functional activity is low and pt required seated rest breaks during ambulation on the rollator. Ambulating a max of 25' at a time during this session. Pt was educated on precautions, brace application/wearing schedule, appropriate activity progression, and car transfer. Pt currently with functional limitations due to the deficits listed below (see PT Problem List). Pt will benefit from skilled PT to increase their independence and safety with mobility to allow discharge to the venue listed below.          If plan is discharge home, recommend the following: A little help with walking and/or transfers;A little help with bathing/dressing/bathroom;Assistance with cooking/housework;Assist for transportation;Help with stairs or ramp for entrance   Can travel by private vehicle        Equipment Recommendations None recommended by PT  Recommendations for Other Services       Functional Status Assessment Patient has had a recent decline in their functional status and demonstrates the ability to make significant improvements in function in a reasonable and predictable amount of time.     Precautions / Restrictions Precautions Precautions: Back;Fall Precaution Booklet Issued: Yes (comment) Precaution Comments:  Reviewed handout with pt and husband and pt was cued for precautions during functional mobility. Required Braces or Orthoses:  (No brace needed order) Restrictions Weight Bearing Restrictions: No      Mobility  Bed Mobility               General bed mobility comments: Pt was received sitting up EOB. High bed height at home - OT educated on use of stool.    Transfers Overall transfer level: Needs assistance Equipment used: Rollator (4 wheels) Transfers: Sit to/from Stand Sit to Stand: Min guard           General transfer comment: VC's for hand placement on seated surface for safety as well as improved posture with sit>stand. Husband asking if he can let her pull up to stand from him, and they were both educated on safety if she was unable to power up on her own.    Ambulation/Gait Ambulation/Gait assistance: Min guard Gait Distance (Feet): 25 Feet (25', seated rest, 10', seated rest, 15') Assistive device: Rollator (4 wheels) Gait Pattern/deviations: Step-through pattern, Decreased stride length, Trunk flexed Gait velocity: Decreased Gait velocity interpretation: <1.8 ft/sec, indicate of risk for recurrent falls   General Gait Details: Slow, effortful advancement of LE's due to pain. As gait training progressed, reports legs loosening up some and gait speed improved. x2 seated rest breaks on rollator due to pain and fatigue.  Stairs            Wheelchair Mobility     Tilt Bed    Modified Rankin (Stroke Patients Only)       Balance Overall balance assessment: Needs assistance Sitting-balance support: No upper extremity supported, Feet supported Sitting balance-Leahy  Scale: Fair     Standing balance support: No upper extremity supported Standing balance-Leahy Scale: Fair Standing balance comment: statically; for dynamic movements requires UE support                             Pertinent Vitals/Pain Pain Assessment Pain Assessment:  Faces Faces Pain Scale: Hurts even more Pain Location: Incision site Pain Descriptors / Indicators: Operative site guarding, Aching, Sore Pain Intervention(s): Limited activity within patient's tolerance, Monitored during session, Repositioned    Home Living Family/patient expects to be discharged to:: Private residence Living Arrangements: Spouse/significant other Available Help at Discharge: Family Type of Home: House Home Access: Ramped entrance       Home Layout: One level Home Equipment: Rollator (4 wheels)      Prior Function Prior Level of Function : Independent/Modified Independent             Mobility Comments: Using rollator for support, reports walking to an oak tree outside for exercise.       Hand Dominance        Extremity/Trunk Assessment   Upper Extremity Assessment Upper Extremity Assessment: Defer to OT evaluation    Lower Extremity Assessment Lower Extremity Assessment: Generalized weakness (Consistent with pre-op diagnosis)    Cervical / Trunk Assessment Cervical / Trunk Assessment: Back Surgery  Communication   Communication: No difficulties  Cognition Arousal/Alertness: Awake/alert Behavior During Therapy: WFL for tasks assessed/performed Overall Cognitive Status: Within Functional Limits for tasks assessed                                          General Comments      Exercises     Assessment/Plan    PT Assessment Patient needs continued PT services  PT Problem List Decreased strength;Decreased activity tolerance;Decreased balance;Decreased mobility;Decreased knowledge of use of DME;Decreased safety awareness;Decreased knowledge of precautions;Pain       PT Treatment Interventions DME instruction;Gait training;Functional mobility training;Therapeutic activities;Therapeutic exercise;Balance training;Patient/family education    PT Goals (Current goals can be found in the Care Plan section)  Acute Rehab PT  Goals Patient Stated Goal: Home today, be able to walk out to their oak tree PT Goal Formulation: With patient/family Time For Goal Achievement: 05/03/23 Potential to Achieve Goals: Good    Frequency Min 1X/week     Co-evaluation               AM-PAC PT "6 Clicks" Mobility  Outcome Measure Help needed turning from your back to your side while in a flat bed without using bedrails?: A Little Help needed moving from lying on your back to sitting on the side of a flat bed without using bedrails?: A Little Help needed moving to and from a bed to a chair (including a wheelchair)?: A Little Help needed standing up from a chair using your arms (e.g., wheelchair or bedside chair)?: A Little Help needed to walk in hospital room?: A Little Help needed climbing 3-5 steps with a railing? : A Little 6 Click Score: 18    End of Session Equipment Utilized During Treatment: Gait belt Activity Tolerance: Patient tolerated treatment well Patient left: in bed;with call bell/phone within reach;with family/visitor present Nurse Communication: Mobility status PT Visit Diagnosis: Unsteadiness on feet (R26.81);Pain Pain - part of body:  (back)    Time: 6063-0160 PT Time Calculation (  min) (ACUTE ONLY): 21 min   Charges:   PT Evaluation $PT Eval Low Complexity: 1 Low   PT General Charges $$ ACUTE PT VISIT: 1 Visit         Conni Slipper, PT, DPT Acute Rehabilitation Services Secure Chat Preferred Office: [phone]   Marylynn Pearson 04/26/2023, 11:14 AM

## 2023-04-26 NOTE — Evaluation (Signed)
Occupational Therapy Evaluation and Discharge Patient Details Name: Kelli Juarez MRN: [id number] DOB: Apr 19, 1971 Today's Date: 04/26/2023   History of Present Illness Pt is a 52 yo female s/p removal of L4, L5 pedicle screws, revision of L4-5 pseudoarthrosis, L4-S1 posterolateral instrumented fusion with placement of new bilateral L4, L5, and S1 pedicle screws, left open L5-S1 transforaminal lumbar interbody fusion due to right sacral numbness and back pain, workup showed a pseudoarthrosis at 4-5 with 5-1 pars defects as well.   Clinical Impression   This 52 yo female admitted and underwent above presents to acute OT with all education completed with pt and her husband as well as post op handout provided. No further OT needs, we will sign off.      Recommendations for follow up therapy are one component of a multi-disciplinary discharge planning process, led by the attending physician.  Recommendations may be updated based on patient status, additional functional criteria and insurance authorization.   Assistance Recommended at Discharge PRN  Patient can return home with the following A little help with bathing/dressing/bathroom;Assistance with cooking/housework;Assist for transportation    Functional Status Assessment  Patient has had a recent decline in their functional status and demonstrates the ability to make significant improvements in function in a reasonable and predictable amount of time. (without futher skilled OT needs)  Equipment Recommendations  BSC/3in1       Precautions / Restrictions Precautions Precautions: Back;Fall Precaution Booklet Issued: Yes (comment) Precaution Comments: Reviewed handout with pt and husband Required Braces or Orthoses:  (No brace needed order) Restrictions Weight Bearing Restrictions: No      Mobility Bed Mobility Overal bed mobility: Modified Independent             General bed mobility comments: increased time, educated pt and  husband on how he may have to A her in and OOB due to high bed and step stool    Transfers Overall transfer level: Needs assistance Equipment used: Rollator (4 wheels) Transfers: Sit to/from Stand Sit to Stand: Min guard           General transfer comment: increased time for sit<>stand due to pain      Balance Overall balance assessment: Needs assistance Sitting-balance support: No upper extremity supported, Feet supported Sitting balance-Leahy Scale: Good     Standing balance support: Bilateral upper extremity supported Standing balance-Leahy Scale: Poor                             ADL either performed or assessed with clinical judgement   ADL                                         General ADL Comments: Educated pt on use of wet wipes for back peri care, use fo 2 cups for brushing teeth/oral care so no bending over the sink, use of pillows for positioning in bed, building up sitting tolerance, use of reacher and sock aid for LBD (issued her one of each as well as a long handled sponge), educated husband on how to A her OOB and back into bed as needed (don't pull on her, let her pull on you while you are steadying yourself--for in and OOB), and side step into tub.     Vision Patient Visual Report: No change from baseline  Pertinent Vitals/Pain Pain Assessment Pain Assessment: 0-10 Faces Pain Scale:  (9.5) Pain Location: Incision site Pain Descriptors / Indicators: Operative site guarding, Aching, Sore Pain Intervention(s): Limited activity within patient's tolerance, Monitored during session, Repositioned, Premedicated before session     Hand Dominance Right   Extremity/Trunk Assessment Upper Extremity Assessment Upper Extremity Assessment: Overall WFL for tasks assessed      Communication Communication Communication: No difficulties   Cognition Arousal/Alertness: Awake/alert Behavior During Therapy: WFL for tasks  assessed/performed Overall Cognitive Status: Within Functional Limits for tasks assessed                                                  Home Living Family/patient expects to be discharged to:: Private residence Living Arrangements: Spouse/significant other Available Help at Discharge: Family Type of Home: House Home Access: Ramped entrance     Home Layout: One level     Bathroom Shower/Tub: Chief Strategy Officer: Handicapped height     Home Equipment: Rollator (4 wheels)          Prior Functioning/Environment Prior Level of Function : Independent/Modified Independent             Mobility Comments: Using rollator for support          OT Problem List: Decreased strength;Decreased range of motion;Impaired balance (sitting and/or standing);Pain         OT Goals(Current goals can be found in the care plan section) Acute Rehab OT Goals Patient Stated Goal: to go home today and pain to be better         AM-PAC OT "6 Clicks" Daily Activity     Outcome Measure Help from another person eating meals?: None Help from another person taking care of personal grooming?: A Little Help from another person toileting, which includes using toliet, bedpan, or urinal?: A Little Help from another person bathing (including washing, rinsing, drying)?: A Little Help from another person to put on and taking off regular upper body clothing?: A Little Help from another person to put on and taking off regular lower body clothing?: A Little 6 Click Score: 19   End of Session Equipment Utilized During Treatment: Gait belt;Rolling walker (2 wheels) Nurse Communication:  (3n1 needed)  Activity Tolerance: Patient limited by pain (but did do all asked of her) Patient left:  (sitting EOB waiting to work wiht PT)  OT Visit Diagnosis: Unsteadiness on feet (R26.81);Other abnormalities of gait and mobility (R26.89);Pain Pain - part of body:  (incisional)                 Time: 1610-9604 OT Time Calculation (min): 38 min Charges:  OT General Charges $OT Visit: 1 Visit OT Evaluation $OT Eval Moderate Complexity: 1 Mod OT Treatments $Self Care/Home Management : 23-37 mins  Lindon Romp OT Acute Rehabilitation Services Office [phone]    Kelli Juarez 04/26/2023, 11:32 AM

## 2023-04-26 NOTE — Progress Notes (Signed)
Neurosurgery Service Progress Note  Subjective: No acute events overnight. Reports improvement in LE radicular pain and labia numbness. Back pain as expected for procedure.    Objective: Vitals:   04/25/23 2005 04/26/23 0046 04/26/23 0511 04/26/23 0752  BP: (!) 136/95 (!) 156/98 (!) 155/106 129/77  Pulse: 65 86 79 85  Resp: 20 20 20 16   Temp: 98.2 F (36.8 C) 97.6 F (36.4 C) 97.7 F (36.5 C) 98.4 F (36.9 C)  TempSrc: Oral Oral Oral Oral  SpO2: 92% 100% 97% 97%  Weight:      Height:        Physical Exam: Strength 5/5 x4 and SILTx4, incision c/d/I   Assessment & Plan: 52 y.o. female s/p revision L4-S1 TLIF / PSF, recovering well.  -PT/OT -discharge home today   Emilee Hero, PA-C 04/26/23 8:58 AM

## 2023-04-26 NOTE — Plan of Care (Signed)
Pt doing well. Pt and husband given D/C instructions with verbal understanding. Pt's incision is clean and dry with no sign of infection. Pt's IV was removed prior to D/C. Pt received 3-n-1 from [employer] per MD order. Pt D/C'd home via wheelchair per MD order. Pt is stable @ D/C and has no other needs at this time. Rema Fendt, RN

## 2023-04-27 ENCOUNTER — Encounter (HOSPITAL_COMMUNITY): Payer: Self-pay | Admitting: Neurological Surgery

## 2023-04-27 ENCOUNTER — Telehealth (INDEPENDENT_AMBULATORY_CARE_PROVIDER_SITE_OTHER): Payer: Self-pay

## 2023-04-27 NOTE — Transitions of Care (Post Inpatient/ED Visit) (Signed)
   04/27/2023  Name: Kelli Juarez MRN: [id number] DOB: [birth year]  Today's TOC FU Call Status: Today's TOC FU Call Status:: Successful TOC FU Call Completed Unsuccessful Call (1st Attempt) Date: 04/27/23 Unsuccessful Call (2nd Attempt) Date: 04/27/23 [facility] FU Call Complete Date: 04/27/23  Attempted to reach the patient regarding the most recent Inpatient/ED visit.  Follow Up Plan: No further outreach attempts will be made at this time. We have been unable to contact the patient.  Signature   Woodfin Ganja LPN [facility] Nurse Health Advisor Direct Dial [phone]

## 2023-04-27 NOTE — Transitions of Care (Post Inpatient/ED Visit) (Signed)
   04/27/2023  Name: Kelli Juarez MRN: [id number] DOB: 05-Oct-1970  Today's TOC FU Call Status: Today's TOC FU Call Status:: Unsuccessful Call (1st Attempt) Unsuccessful Call (1st Attempt) Date: 04/27/23  Attempted to reach the patient regarding the most recent Inpatient/ED visit.  Follow Up Plan: Additional outreach attempts will be made to reach the patient to complete the Transitions of Care (Post Inpatient/ED visit) call.   Signature   Woodfin Ganja LPN [facility] Nurse Health Advisor Direct Dial [phone]

## 2023-05-25 DIAGNOSIS — R600 Localized edema: Secondary | ICD-10-CM | POA: Insufficient documentation

## 2023-07-07 DIAGNOSIS — E119 Type 2 diabetes mellitus without complications: Secondary | ICD-10-CM | POA: Insufficient documentation

## 2023-07-13 ENCOUNTER — Encounter: Payer: Self-pay | Admitting: Physician Assistant

## 2023-07-13 DIAGNOSIS — R945 Abnormal results of liver function studies: Secondary | ICD-10-CM | POA: Insufficient documentation

## 2023-09-25 ENCOUNTER — Emergency Department (HOSPITAL_COMMUNITY): Payer: Medicaid Other

## 2023-09-25 ENCOUNTER — Encounter (HOSPITAL_COMMUNITY): Payer: Self-pay

## 2023-09-25 ENCOUNTER — Emergency Department (HOSPITAL_COMMUNITY)
Admission: EM | Admit: 2023-09-25 | Discharge: 2023-09-25 | Disposition: A | Discharge status: Home / Self Care | Payer: Medicaid Other | Attending: Emergency Medicine | Admitting: Emergency Medicine

## 2023-09-25 ENCOUNTER — Other Ambulatory Visit: Payer: Self-pay

## 2023-09-25 DIAGNOSIS — Z7984 Long term (current) use of oral hypoglycemic drugs: Secondary | ICD-10-CM | POA: Insufficient documentation

## 2023-09-25 DIAGNOSIS — Z7951 Long term (current) use of inhaled steroids: Secondary | ICD-10-CM | POA: Insufficient documentation

## 2023-09-25 DIAGNOSIS — E119 Type 2 diabetes mellitus without complications: Secondary | ICD-10-CM | POA: Insufficient documentation

## 2023-09-25 DIAGNOSIS — J449 Chronic obstructive pulmonary disease, unspecified: Secondary | ICD-10-CM | POA: Diagnosis not present

## 2023-09-25 DIAGNOSIS — M5440 Lumbago with sciatica, unspecified side: Secondary | ICD-10-CM | POA: Insufficient documentation

## 2023-09-25 DIAGNOSIS — I10 Essential (primary) hypertension: Secondary | ICD-10-CM | POA: Insufficient documentation

## 2023-09-25 DIAGNOSIS — Z79899 Other long term (current) drug therapy: Secondary | ICD-10-CM | POA: Insufficient documentation

## 2023-09-25 DIAGNOSIS — M5386 Other specified dorsopathies, lumbar region: Secondary | ICD-10-CM

## 2023-09-25 DIAGNOSIS — M545 Low back pain, unspecified: Secondary | ICD-10-CM | POA: Diagnosis present

## 2023-09-25 LAB — URINALYSIS, ROUTINE W REFLEX MICROSCOPIC
Bilirubin Urine: NEGATIVE
Glucose, UA: NEGATIVE mg/dL
Ketones, ur: NEGATIVE mg/dL
Leukocytes,Ua: NEGATIVE
Nitrite: NEGATIVE
Protein, ur: NEGATIVE mg/dL
Specific Gravity, Urine: 1.005 (ref 1.005–1.030)
pH: 7 (ref 5.0–8.0)

## 2023-09-25 LAB — I-STAT CHEM 8, ED
BUN: 29 mg/dL — ABNORMAL HIGH (ref 6–20)
Calcium, Ion: 1.12 mmol/L — ABNORMAL LOW (ref 1.15–1.40)
Chloride: 106 mmol/L (ref 98–111)
Creatinine, Ser: 1.3 mg/dL — ABNORMAL HIGH (ref 0.44–1.00)
Glucose, Bld: 78 mg/dL (ref 70–99)
HCT: 38 % (ref 36.0–46.0)
Hemoglobin: 12.9 g/dL (ref 12.0–15.0)
Potassium: 4.5 mmol/L (ref 3.5–5.1)
Sodium: 138 mmol/L (ref 135–145)
TCO2: 23 mmol/L (ref 22–32)

## 2023-09-25 MED ORDER — OXYCODONE-ACETAMINOPHEN 5-325 MG PO TABS
2.0000 | ORAL_TABLET | Freq: Once | ORAL | Status: AC
  Administered 2023-09-25: 2 via ORAL
  Filled 2023-09-25: qty 2

## 2023-09-25 MED ORDER — LORAZEPAM 1 MG PO TABS
1.0000 mg | ORAL_TABLET | Freq: Once | ORAL | Status: AC | PRN
  Administered 2023-09-25: 1 mg via ORAL
  Filled 2023-09-25: qty 1

## 2023-09-25 MED ORDER — GADOBUTROL 1 MMOL/ML IV SOLN
8.0000 mL | Freq: Once | INTRAVENOUS | Status: AC | PRN
  Administered 2023-09-25: 8 mL via INTRAVENOUS

## 2023-09-25 MED ORDER — HYDROMORPHONE HCL 1 MG/ML IJ SOLN
1.0000 mg | Freq: Once | INTRAMUSCULAR | Status: AC
  Administered 2023-09-25: 1 mg via INTRAVENOUS
  Filled 2023-09-25: qty 1

## 2023-09-25 MED ORDER — DEXAMETHASONE SODIUM PHOSPHATE 10 MG/ML IJ SOLN
10.0000 mg | Freq: Once | INTRAMUSCULAR | Status: AC
  Administered 2023-09-25: 10 mg via INTRAVENOUS
  Filled 2023-09-25: qty 1

## 2023-09-25 MED ORDER — OXYCODONE-ACETAMINOPHEN 5-325 MG PO TABS: 1.0000 | ORAL_TABLET | Freq: Four times a day (QID) | ORAL | 0 refills | Status: DC | PRN

## 2023-09-25 MED ORDER — HYDROMORPHONE HCL 2 MG PO TABS: 1.0000 mg | ORAL_TABLET | Freq: Once | ORAL | Status: DC

## 2023-09-25 MED ORDER — METHYLPREDNISOLONE 4 MG PO TBPK: ORAL_TABLET | Freq: Every day | ORAL | 0 refills | Status: DC

## 2023-09-25 MED ORDER — ONDANSETRON 4 MG PO TBDP
4.0000 mg | ORAL_TABLET | Freq: Once | ORAL | Status: AC
  Administered 2023-09-25: 4 mg via ORAL
  Filled 2023-09-25: qty 1

## 2023-09-25 NOTE — ED Provider Triage Note (Cosign Needed)
Emergency Medicine Provider Triage Evaluation Note  Kelli Juarez , a 52 y.o. female  was evaluated in triage.  Pt complains of low back pain and left leg numbness and labial numbness for the last 2 weeks since being out of pain medicine (she states she is b/w pain med doctors). Hx of L4-S1 TLIF/PSF. Endorses new urinary incontinence. Reports night sweats.   Review of Systems  Positive: Urinary incontinence Negative: fevers  Physical Exam  BP (!) 146/96 (BP Location: Right Arm)   Pulse 87   Temp 99 F (37.2 C)   Resp 18   SpO2 100%  Gen:   Awake, no distress   Resp:  Normal effort  MSK:   Moves extremities without difficulty  Other:  Decreased strength of BLE, numbness of L groin  Medical Decision Making  Medically screening exam initiated at 12:04 PM.  Appropriate orders placed.  Kelli Juarez was informed that the remainder of the evaluation will be completed by another provider, this initial triage assessment does not replace that evaluation, and the importance of remaining in the ED until their evaluation is complete.     Pete Pelt, [place] 09/25/23 1208

## 2023-09-25 NOTE — ED Triage Notes (Addendum)
Pt states she had back surgery July 27; seeing pain management x 3 years; states she has not had pain meds x 2 weeks; went to Dr. Jackelyn Knife office today, states was told to come here due to nerve damage and weakness in legs, and to ask for Dr. Yetta Barre; states she has been feeling weak in bilateral legs x several days; endorsers some urinary incontinence; endorses numbness to R labia, rectum, and r buttocks

## 2023-09-25 NOTE — ED Provider Notes (Signed)
[place] EMERGENCY DEPARTMENT AT [facility] Provider Note   CSN: [id number] Arrival date & time: 09/25/23  1139     History  No chief complaint on file.   Kelli Juarez is a 52 y.o. female.  Pt is a 52 yo female with pmhx significant for htn, chronic pain, anxiety, dm2, copd, kidney stones, and ptsd.  Pt said she is in between pain medicine specialists and has not been on any pain medication in 2 weeks.  She had been on oxy 20s.  She has an appt with Dr. Franky Macho in 2 weeks.  She had surgery on her low back in July.  She said she has worsening numbness and pain.  She said she went to the office today and they told her to come to the ED and request to see Dr. Yetta Barre who is on call.  Pt is able to ambulate with her walker.         Home Medications Prior to Admission medications   Medication Sig Start Date End Date Taking? Authorizing Provider  methylPREDNISolone (MEDROL DOSEPAK) 4 MG TBPK tablet Take by mouth daily. Day 1:  2 pills at breakfast, 1 pill at lunch, 1 pill after supper, 2 pills at bedtime;Day 2:  1 pill at breakfast, 1 pill at lunch, 1 pill after supper, 2 pills at bedtime;Day 3:  1 pill at breakfast, 1 pill at lunch, 1 pill after supper, 1 pill at bedtime;Day 4:  1 pill at breakfast, 1 pill at lunch, 1 pill at bedtime;Day 5:  1 pill at breakfast, 1 pill at bedtime;Day 6:  1 pill at breakfast 09/25/23  Yes Jacalyn Lefevre, MD  oxyCODONE-acetaminophen (PERCOCET/ROXICET) 5-325 MG tablet Take 1 tablet by mouth every 6 (six) hours as needed for severe pain (pain score 7-10). 09/25/23  Yes Jacalyn Lefevre, MD  albuterol (PROVENTIL) (2.5 MG/3ML) 0.083% nebulizer solution Take 3 mLs (2.5 mg total) by nebulization every 6 (six) hours as needed for wheezing or shortness of breath. 02/23/22   Martina Sinner, MD  amphetamine-dextroamphetamine (ADDERALL) 15 MG tablet Take 15 mg by mouth daily. 01/06/22   [provider]  busPIRone (BUSPAR) 15 MG tablet Take 15 mg  by mouth in the morning, at noon, in the evening, and at bedtime.    [provider]  cyclobenzaprine (FLEXERIL) 10 MG tablet Take 1 tablet (10 mg total) by mouth 3 (three) times daily as needed for muscle spasms. 04/26/23   Cosentino, Talmadge Chad, PA-C  diazepam (VALIUM) 5 MG tablet Take 2.5 mg by mouth 3 (three) times daily as needed for anxiety. 01/05/22   [provider]  FLUoxetine (PROZAC) 40 MG capsule Take 80 mg by mouth daily.    [provider]  fluticasone (FLONASE) 50 MCG/ACT nasal spray Place 1 spray into both nostrils daily as needed for allergies or rhinitis.    [provider]  furosemide (LASIX) 20 MG tablet Take 1 tablet (20 mg total) by mouth daily as needed. Patient taking differently: Take 20 mg by mouth daily. 02/07/22   Marykay Lex, MD  gabapentin (NEURONTIN) 600 MG tablet Take 600 mg by mouth 2 (two) times daily. 01/14/22   [provider]  lisinopril (ZESTRIL) 40 MG tablet Take 40 mg by mouth daily. 01/10/22   [provider]  metFORMIN (GLUCOPHAGE) 500 MG tablet Take 1 tablet by mouth daily. 12/29/22   [provider]  oxycodone (OXY-IR) 5 MG capsule Take 1 capsule (5 mg total) by mouth  every 4 (four) hours as needed (severe breakthrough pain). 04/26/23   Iran Sizer, PA-C  oxyCODONE-acetaminophen (PERCOCET) 10-325 MG tablet Take 1 tablet by mouth 5 (five) times daily as needed for pain.    [provider]  umeclidinium-vilanterol (ANORO ELLIPTA) 62.5-25 MCG/ACT AEPB Inhale 1 puff into the lungs daily. Patient not taking: Reported on 04/14/2023 02/23/22   Martina Sinner, MD  VENTOLIN HFA 108 [phone] Base) MCG/ACT inhaler Inhale 1-2 puffs into the lungs every 6 (six) hours as needed for wheezing or shortness of breath.    [provider]      Allergies    Penicillins and Toradol [ketorolac tromethamine]    Review of Systems   Review of Systems  Musculoskeletal:  Positive for back pain.   Neurological:  Positive for numbness.    Physical Exam Updated Vital Signs BP 127/71 (BP Location: Right Arm)   Pulse 76   Temp 98.4 F (36.9 C)   Resp 17   SpO2 100%  Physical Exam Vitals and nursing note reviewed.  Constitutional:      Appearance: Normal appearance.  HENT:     Head: Normocephalic and atraumatic.     Right Ear: External ear normal.     Left Ear: External ear normal.     Nose: Nose normal.     Mouth/Throat:     Mouth: Mucous membranes are moist.     Pharynx: Oropharynx is clear.  Eyes:     Extraocular Movements: Extraocular movements intact.     Conjunctiva/sclera: Conjunctivae normal.     Pupils: Pupils are equal, round, and reactive to light.  Cardiovascular:     Rate and Rhythm: Normal rate and regular rhythm.     Pulses: Normal pulses.     Heart sounds: Normal heart sounds.  Pulmonary:     Effort: Pulmonary effort is normal.     Breath sounds: Normal breath sounds.  Abdominal:     General: Abdomen is flat. Bowel sounds are normal.     Palpations: Abdomen is soft.  Musculoskeletal:        General: Normal range of motion.     Cervical back: Normal range of motion and neck supple.  Skin:    General: Skin is warm.     Capillary Refill: Capillary refill takes less than 2 seconds.  Neurological:     Mental Status: She is alert and oriented to person, place, and time.     Comments: BLE weakness   Psychiatric:        Mood and Affect: Mood normal.        Behavior: Behavior normal.     ED Results / Procedures / Treatments   Labs (all labs ordered are listed, but only abnormal results are displayed) Labs Reviewed  URINALYSIS, ROUTINE W REFLEX MICROSCOPIC - Abnormal; Notable for the following components:      Result Value   Hgb urine dipstick SMALL (*)    Bacteria, UA RARE (*)    All other components within normal limits  I-STAT CHEM 8, ED - Abnormal; Notable for the following components:   BUN 29 (*)    Creatinine, Ser 1.30 (*)    Calcium,  Ion 1.12 (*)    All other components within normal limits    EKG None  Radiology MR LUMBAR SPINE W WO CONTRAST Result Date: 09/25/2023 CLINICAL DATA:  Mid-back pain, neuro deficit; Low back pain, cauda equina syndrome suspected. Numbness of R labia, L leg weakness. EXAM: MRI THORACIC AND  LUMBAR SPINE WITHOUT AND WITH CONTRAST TECHNIQUE: Multiplanar and multiecho pulse sequences of the thoracic and lumbar spine were obtained without and with intravenous contrast. CONTRAST:  8mL GADAVIST GADOBUTROL 1 MMOL/ML IV SOLN COMPARISON:  Lumbar spine MRI 02/15/2023 FINDINGS: MRI THORACIC SPINE FINDINGS Alignment:  Normal. Vertebrae: No fracture, suspicious marrow lesion, or evidence of discitis. Cord:  Normal signal and morphology.  No abnormal enhancement. Paraspinal and other soft tissues: Unremarkable. Disc levels: Small central disc protrusions at T1-2, T4-5, and T5-6, T6-7, and T7-8, small right paracentral disc protrusion at T8-9, small left paracentral disc protrusion at T9-10, and up to mild multilevel disc bulging. No significant spinal stenosis or cord compression. Moderate multilevel upper thoracic facet arthrosis. Moderate to severe right and mild left neural foraminal stenosis at T1-2. MRI LUMBAR SPINE FINDINGS Segmentation:  Standard. Alignment:  Unchanged grade 1 anterolisthesis of L4 on L5. Vertebrae: No fracture, suspicious marrow lesion, or evidence of discitis. L4-S1 posterior and interbody fusion with L5-S1 fusion having been performed in the interim. Conus medullaris: Extends to the L1 level and appears normal. Paraspinal and other soft tissues: Postoperative changes in the posterior lumbar soft tissues. No significant fluid collection. 1.5 cm left adrenal nodule characterized as an adenoma on a 06/26/2022 abdominal CT and for which no follow-up imaging is recommended. Disc levels: T12-L1 and L1-2: Negative. L2-3: Normal disc.  Mild facet hypertrophy without stenosis. L3-4: Minimal disc bulging  and moderate facet and ligamentum flavum hypertrophy result in new mild spinal stenosis without significant neural foraminal stenosis. L4-5: Prior posterior decompression and fusion. Widely patent spinal canal. At most mild bilateral neural foraminal narrowing due to disc uncovering/bulging and facet hypertrophy. L5-S1: Interval fusion. Patent spinal canal and right neural foramen. Suspected mild-to-moderate residual left neural foraminal stenosis due to disc bulging and facet hypertrophy. IMPRESSION: 1. No acute abnormality in the thoracic or lumbar spine. Normal appearance of the thoracic spinal cord, conus, and cauda equina. 2. No thoracic spinal stenosis. 3. Interval L5-S1 fusion with patent spinal canal and suspected mild-to-moderate residual left neural foraminal stenosis. 4. New mild spinal stenosis at L3-4. Electronically Signed   By: Sebastian Ache M.D.   On: 09/25/2023 15:46   MR THORACIC SPINE W WO CONTRAST Result Date: 09/25/2023 CLINICAL DATA:  Mid-back pain, neuro deficit; Low back pain, cauda equina syndrome suspected. Numbness of R labia, L leg weakness. EXAM: MRI THORACIC AND LUMBAR SPINE WITHOUT AND WITH CONTRAST TECHNIQUE: Multiplanar and multiecho pulse sequences of the thoracic and lumbar spine were obtained without and with intravenous contrast. CONTRAST:  8mL GADAVIST GADOBUTROL 1 MMOL/ML IV SOLN COMPARISON:  Lumbar spine MRI 02/15/2023 FINDINGS: MRI THORACIC SPINE FINDINGS Alignment:  Normal. Vertebrae: No fracture, suspicious marrow lesion, or evidence of discitis. Cord:  Normal signal and morphology.  No abnormal enhancement. Paraspinal and other soft tissues: Unremarkable. Disc levels: Small central disc protrusions at T1-2, T4-5, and T5-6, T6-7, and T7-8, small right paracentral disc protrusion at T8-9, small left paracentral disc protrusion at T9-10, and up to mild multilevel disc bulging. No significant spinal stenosis or cord compression. Moderate multilevel upper thoracic facet  arthrosis. Moderate to severe right and mild left neural foraminal stenosis at T1-2. MRI LUMBAR SPINE FINDINGS Segmentation:  Standard. Alignment:  Unchanged grade 1 anterolisthesis of L4 on L5. Vertebrae: No fracture, suspicious marrow lesion, or evidence of discitis. L4-S1 posterior and interbody fusion with L5-S1 fusion having been performed in the interim. Conus medullaris: Extends to the L1 level and appears normal. Paraspinal and other soft  tissues: Postoperative changes in the posterior lumbar soft tissues. No significant fluid collection. 1.5 cm left adrenal nodule characterized as an adenoma on a 06/26/2022 abdominal CT and for which no follow-up imaging is recommended. Disc levels: T12-L1 and L1-2: Negative. L2-3: Normal disc.  Mild facet hypertrophy without stenosis. L3-4: Minimal disc bulging and moderate facet and ligamentum flavum hypertrophy result in new mild spinal stenosis without significant neural foraminal stenosis. L4-5: Prior posterior decompression and fusion. Widely patent spinal canal. At most mild bilateral neural foraminal narrowing due to disc uncovering/bulging and facet hypertrophy. L5-S1: Interval fusion. Patent spinal canal and right neural foramen. Suspected mild-to-moderate residual left neural foraminal stenosis due to disc bulging and facet hypertrophy. IMPRESSION: 1. No acute abnormality in the thoracic or lumbar spine. Normal appearance of the thoracic spinal cord, conus, and cauda equina. 2. No thoracic spinal stenosis. 3. Interval L5-S1 fusion with patent spinal canal and suspected mild-to-moderate residual left neural foraminal stenosis. 4. New mild spinal stenosis at L3-4. Electronically Signed   By: Sebastian Ache M.D.   On: 09/25/2023 15:46    Procedures Procedures    Medications Ordered in ED Medications  dexamethasone (DECADRON) injection 10 mg (has no administration in time range)  HYDROmorphone (DILAUDID) injection 1 mg (has no administration in time range)   oxyCODONE-acetaminophen (PERCOCET/ROXICET) 5-325 MG per tablet 2 tablet (2 tablets Oral Given 09/25/23 1228)  ondansetron (ZOFRAN-ODT) disintegrating tablet 4 mg (4 mg Oral Given 09/25/23 1228)  LORazepam (ATIVAN) tablet 1 mg (1 mg Oral Given 09/25/23 1234)  gadobutrol (GADAVIST) 1 MMOL/ML injection 8 mL (8 mLs Intravenous Contrast Given 09/25/23 1345)  HYDROmorphone (DILAUDID) injection 1 mg (1 mg Intravenous Given 09/25/23 1705)    ED Course/ Medical Decision Making/ A&P                                 Medical Decision Making Risk Prescription drug management.   This patient presents to the ED for concern of back pain and numbness, this involves an extensive number of treatment options, and is a complaint that carries with it a high risk of complications and morbidity.  The differential diagnosis includes cauda equina, msk, chronic pain   Co morbidities that complicate the patient evaluation  htn, chronic pain, anxiety, dm2, copd, kidney stones, and ptsd.   Additional history obtained:  Additional history obtained from epic chart review External records from outside source obtained and reviewed including s.o.   Lab Tests:  I Ordered, and personally interpreted labs.  The pertinent results include:  ua nl, chem-9 nl other than cr sl elevated at 1.3   Imaging Studies ordered:  I ordered imaging studies including mri thoracic/lumbar  I independently visualized and interpreted imaging which showed   No acute abnormality in the thoracic or lumbar spine. Normal  appearance of the thoracic spinal cord, conus, and cauda equina.  2. No thoracic spinal stenosis.  3. Interval L5-S1 fusion with patent spinal canal and suspected  mild-to-moderate residual left neural foraminal stenosis.  4. New mild spinal stenosis at L3-4.   I agree with the radiologist interpretation  Medicines ordered and prescription drug management:  I ordered medication including dilaudid/decadron  for  sx  Reevaluation of the patient after these medicines showed that the patient improved I have reviewed the patients home medicines and have made adjustments as needed   Test Considered:  mri  Consultations Obtained:  I requested consultation with the NS  NP(Meyran for Dr. Yetta Barre),  and discussed lab and imaging findings as well as pertinent plan - she recommends iv decadron and oral medrol dose pack   Problem List / ED Course:  Back pain which is acute on chronic:  pt does not have evidence of cauda equina on MRI.  She is feeling better with pain medication.  She has an appt with NS in 2 weeks.  She is d/c with instructions to return if worse.  F/u with NS as scheduled.   Reevaluation:  After the interventions noted above, I reevaluated the patient and found that they have :improved   Social Determinants of Health:  Lives at home   Dispostion:  After consideration of the diagnostic results and the patients response to treatment, I feel that the patent would benefit from discharge with outpatient f/u.          Final Clinical Impression(s) / ED Diagnoses Final diagnoses:  Sciatica associated with disorder of lumbar spine    Rx / DC Orders ED Discharge Orders          Ordered    methylPREDNISolone (MEDROL DOSEPAK) 4 MG TBPK tablet  Daily        09/25/23 1742    oxyCODONE-acetaminophen (PERCOCET/ROXICET) 5-325 MG tablet  Every 6 hours PRN        09/25/23 1742              Jacalyn Lefevre, MD 09/25/23 1747

## 2023-09-26 NOTE — Progress Notes (Signed)
Received a call from [facility]  stating that they were unable to fill the patient's prescription for oxycodone due to patient recently had a a prescription filled for Oxy 20mg  by [facility] for a 30 day supply on 08/28/23. Jeral Pinch Transition of Care Supervisor [phone]

## 2024-01-19 DIAGNOSIS — M961 Postlaminectomy syndrome, not elsewhere classified: Secondary | ICD-10-CM | POA: Insufficient documentation

## 2024-03-11 ENCOUNTER — Encounter (HOSPITAL_COMMUNITY): Payer: Self-pay

## 2024-03-11 ENCOUNTER — Encounter: Payer: Self-pay | Admitting: Emergency Medicine

## 2024-03-11 ENCOUNTER — Emergency Department (HOSPITAL_COMMUNITY)

## 2024-03-11 ENCOUNTER — Ambulatory Visit: Admission: EM | Admit: 2024-03-11 | Discharge: 2024-03-11 | Disposition: A | Discharge status: Home / Self Care

## 2024-03-11 ENCOUNTER — Emergency Department (HOSPITAL_COMMUNITY)
Admission: EM | Admit: 2024-03-11 | Discharge: 2024-03-12 | Disposition: A | Discharge status: Home / Self Care | Attending: Emergency Medicine | Admitting: Emergency Medicine

## 2024-03-11 DIAGNOSIS — J449 Chronic obstructive pulmonary disease, unspecified: Secondary | ICD-10-CM | POA: Diagnosis not present

## 2024-03-11 DIAGNOSIS — Z79899 Other long term (current) drug therapy: Secondary | ICD-10-CM | POA: Insufficient documentation

## 2024-03-11 DIAGNOSIS — Z7951 Long term (current) use of inhaled steroids: Secondary | ICD-10-CM | POA: Insufficient documentation

## 2024-03-11 DIAGNOSIS — J168 Pneumonia due to other specified infectious organisms: Secondary | ICD-10-CM | POA: Diagnosis not present

## 2024-03-11 DIAGNOSIS — J189 Pneumonia, unspecified organism: Secondary | ICD-10-CM

## 2024-03-11 DIAGNOSIS — E119 Type 2 diabetes mellitus without complications: Secondary | ICD-10-CM | POA: Insufficient documentation

## 2024-03-11 DIAGNOSIS — R042 Hemoptysis: Secondary | ICD-10-CM

## 2024-03-11 DIAGNOSIS — I10 Essential (primary) hypertension: Secondary | ICD-10-CM | POA: Diagnosis not present

## 2024-03-11 DIAGNOSIS — Z7984 Long term (current) use of oral hypoglycemic drugs: Secondary | ICD-10-CM | POA: Diagnosis not present

## 2024-03-11 DIAGNOSIS — W1830XA Fall on same level, unspecified, initial encounter: Secondary | ICD-10-CM | POA: Insufficient documentation

## 2024-03-11 DIAGNOSIS — R071 Chest pain on breathing: Secondary | ICD-10-CM

## 2024-03-11 DIAGNOSIS — B3789 Other sites of candidiasis: Secondary | ICD-10-CM | POA: Diagnosis not present

## 2024-03-11 DIAGNOSIS — W19XXXA Unspecified fall, initial encounter: Secondary | ICD-10-CM

## 2024-03-11 DIAGNOSIS — R600 Localized edema: Secondary | ICD-10-CM | POA: Diagnosis not present

## 2024-03-11 DIAGNOSIS — R059 Cough, unspecified: Secondary | ICD-10-CM | POA: Diagnosis present

## 2024-03-11 DIAGNOSIS — M546 Pain in thoracic spine: Secondary | ICD-10-CM | POA: Insufficient documentation

## 2024-03-11 DIAGNOSIS — F17200 Nicotine dependence, unspecified, uncomplicated: Secondary | ICD-10-CM | POA: Diagnosis not present

## 2024-03-11 LAB — BASIC METABOLIC PANEL WITH GFR
Anion gap: 12 (ref 5–15)
BUN: 20 mg/dL (ref 6–20)
CO2: 26 mmol/L (ref 22–32)
Calcium: 9.1 mg/dL (ref 8.9–10.3)
Chloride: 98 mmol/L (ref 98–111)
Creatinine, Ser: 1.33 mg/dL — ABNORMAL HIGH (ref 0.44–1.00)
GFR, Estimated: 48 mL/min — ABNORMAL LOW (ref >60)
Glucose, Bld: 83 mg/dL (ref 70–99)
Potassium: 3.8 mmol/L (ref 3.5–5.1)
Sodium: 136 mmol/L (ref 135–145)

## 2024-03-11 LAB — CBC WITH DIFFERENTIAL/PLATELET
Abs Immature Granulocytes: 0.03 10*3/uL (ref 0.00–0.07)
Basophils Absolute: 0.1 10*3/uL (ref 0.0–0.1)
Basophils Relative: 1 %
Eosinophils Absolute: 0.9 10*3/uL — ABNORMAL HIGH (ref 0.0–0.5)
Eosinophils Relative: 6 %
HCT: 29 % — ABNORMAL LOW (ref 36.0–46.0)
Hemoglobin: 9.2 g/dL — ABNORMAL LOW (ref 12.0–15.0)
Immature Granulocytes: 0 %
Lymphocytes Relative: 26 %
Lymphs Abs: 3.6 10*3/uL (ref 0.7–4.0)
MCH: 31.5 pg (ref 26.0–34.0)
MCHC: 31.7 g/dL (ref 30.0–36.0)
MCV: 99.3 fL (ref 80.0–100.0)
Monocytes Absolute: 0.9 10*3/uL (ref 0.1–1.0)
Monocytes Relative: 7 %
Neutro Abs: 8.2 10*3/uL — ABNORMAL HIGH (ref 1.7–7.7)
Neutrophils Relative %: 60 %
Platelets: 201 10*3/uL (ref 150–400)
RBC: 2.92 MIL/uL — ABNORMAL LOW (ref 3.87–5.11)
RDW: 16.1 % — ABNORMAL HIGH (ref 11.5–15.5)
WBC: 13.7 10*3/uL — ABNORMAL HIGH (ref 4.0–10.5)
nRBC: 0 % (ref 0.0–0.2)

## 2024-03-11 LAB — BRAIN NATRIURETIC PEPTIDE: B Natriuretic Peptide: 40.9 pg/mL (ref 0.0–100.0)

## 2024-03-11 LAB — I-STAT CG4 LACTIC ACID, ED: Lactic Acid, Venous: 1.7 mmol/L (ref 0.5–1.9)

## 2024-03-11 LAB — TROPONIN I (HIGH SENSITIVITY): Troponin I (High Sensitivity): 4 ng/L (ref <18)

## 2024-03-11 MED ORDER — SODIUM CHLORIDE 0.9 % IV SOLN
500.0000 mg | Freq: Once | INTRAVENOUS | Status: AC
  Administered 2024-03-11: 500 mg via INTRAVENOUS
  Filled 2024-03-11: qty 5

## 2024-03-11 MED ORDER — CARMEX CLASSIC LIP BALM EX OINT
TOPICAL_OINTMENT | Freq: Once | CUTANEOUS | Status: AC
  Filled 2024-03-11: qty 10

## 2024-03-11 MED ORDER — MORPHINE SULFATE (PF) 4 MG/ML IV SOLN
4.0000 mg | Freq: Once | INTRAVENOUS | Status: AC
  Administered 2024-03-11: 4 mg via INTRAVENOUS
  Filled 2024-03-11: qty 1

## 2024-03-11 MED ORDER — HYDROMORPHONE HCL 1 MG/ML IJ SOLN
1.0000 mg | Freq: Once | INTRAMUSCULAR | Status: AC
  Administered 2024-03-11: 1 mg via INTRAVENOUS
  Filled 2024-03-11: qty 1

## 2024-03-11 MED ORDER — DOXYCYCLINE HYCLATE 100 MG PO TABS
100.0000 mg | ORAL_TABLET | Freq: Once | ORAL | Status: AC
  Administered 2024-03-11: 100 mg via ORAL
  Filled 2024-03-11: qty 1

## 2024-03-11 NOTE — ED Triage Notes (Signed)
 Pt reports increased episodes of SOB x3 days. Cannot identify a trigger for the change. Pulse ox 93-96% in triage with unlabored breaths. Not in acute distress. Pt does report she has COPD and remains an active smoker. Concerned because she started coughing up blood last night.

## 2024-03-11 NOTE — ED Triage Notes (Signed)
 Pt c/o mid back pain r/t rolling out of bed and hitting back on a step stool last night.  Pain score 9/10.  Pt reports a stronger cough than normal, lightheadedness, and mild hemoptysis after fall as well.  Hx of COPD.    Pt reports taking prescribed pain medication w/o relief.

## 2024-03-11 NOTE — ED Notes (Signed)
 Patient is being discharged from the Urgent Care and sent to the Emergency Department via POV . Per Aisha Ali, PA-C, patient is in need of higher level of care due to need for further evaluation of severe pain with breathing and SOB episodes. Patient is aware and verbalizes understanding of plan of care.  Vitals:   03/11/24 1638  BP: 93/63  Pulse: (!) 106  Resp: 20  Temp: 98.5 F (36.9 C)  SpO2: 96%

## 2024-03-11 NOTE — ED Provider Notes (Signed)
  EMERGENCY DEPARTMENT AT [facility] Provider Note   CSN: [id number] Arrival date & time: 03/11/24  1717     Patient presents with: Fall, Back Pain, and Hemoptysis   Kelli Juarez is a 53 y.o. female with past medical history of HTN, COPD, stones, diabetes presents, tobacco use emergency department for evaluation of productive cough, congestion, shortness of breath for the past week as well as a fall 2 nights ago.  Had 4 episodes of hemoptysis last night.  She normally uses breathing treatments as needed but has been using them for twice a day for the past week.  Has been using Lasix  once a day for pedal edema.  No history of PE, recent travel, fevers  Reports that 2 nights ago, she was sleeping in her king-size bed when she rolled off the bed onto the carpet.  Complains of right hip pain, thoracic back pain.  Husband heard a thump and immediately came to her side.  He denies any seizure, postictal period following.    Fall  Back Pain      Prior to Admission medications   Medication Sig Start Date End Date Taking? Authorizing Provider  albuterol  (PROVENTIL ) (2.5 MG/3ML) 0.083% nebulizer solution Take 3 mLs (2.5 mg total) by nebulization every 6 (six) hours as needed for wheezing or shortness of breath. 02/23/22  Yes Kara Dorn NOVAK, MD  amphetamine -dextroamphetamine  (ADDERALL) 15 MG tablet Take 15 mg by mouth daily. 01/06/22  Yes [provider]  atorvastatin (LIPITOR) 20 MG tablet Take 20 mg by mouth. 11/07/23  Yes [provider]  busPIRone  (BUSPAR ) 15 MG tablet Take 15 mg by mouth in the morning, at noon, in the evening, and at bedtime.   Yes [provider]  clotrimazole (LOTRIMIN) 1 % cream Apply to affected area 2 times daily 03/12/24  Yes Minnie Tinnie BRAVO, PA  cyclobenzaprine  (FLEXERIL ) 10 MG tablet Take 1 tablet (10 mg total) by mouth 3 (three) times daily as needed for muscle spasms. 04/26/23  Yes Cosentino, Allison R, PA-C   diazepam  (VALIUM ) 2 MG tablet Take 2 mg by mouth daily as needed. 02/25/24  Yes [provider]  FLUoxetine  (PROZAC ) 40 MG capsule Take 80 mg by mouth daily.   Yes [provider]  furosemide  (LASIX ) 20 MG tablet Take 1 tablet (20 mg total) by mouth daily as needed. Patient taking differently: Take 20 mg by mouth daily. 02/07/22  Yes Anner Alm ORN, MD  gabapentin  (NEURONTIN ) 800 MG tablet Take 800 mg by mouth every 8 (eight) hours. 03/03/24  Yes [provider]  levofloxacin (LEVAQUIN) 750 MG tablet Take 1 tablet (750 mg total) by mouth daily for 5 days. 03/12/24 03/17/24 Yes Minnie Tinnie BRAVO, PA  lisinopril  (ZESTRIL ) 40 MG tablet Take 40 mg by mouth daily. 01/10/22  Yes [provider]  metFORMIN  (GLUCOPHAGE ) 500 MG tablet Take 1 tablet by mouth daily. 12/29/22  Yes [provider]  ondansetron  (ZOFRAN -ODT) 4 MG disintegrating tablet Take 1 tablet (4 mg total) by mouth every 8 (eight) hours as needed for nausea or vomiting. 03/12/24  Yes Minnie Tinnie BRAVO, PA  Oxycodone  HCl 20 MG TABS Take 1 tablet by mouth every 6 (six) hours. 03/07/24  Yes [provider]  VENTOLIN  HFA 108 (90 Base) MCG/ACT inhaler Inhale 1-2 puffs into the lungs every 6 (six) hours as needed for wheezing or shortness of breath.   Yes [provider]  WEGOVY 1 MG/0.5ML SOAJ  02/10/24  Yes [provider]  ACCU-CHEK GUIDE TEST test strip 2 (two) times daily. 10/19/23   [provider]  cloNIDine (CATAPRES) 0.1 MG tablet Take 0.1 mg by mouth 2 (two) times daily. Patient not taking: Reported on 03/11/2024 09/18/23   [provider]  diazepam  (VALIUM ) 5 MG tablet Take 2.5 mg by mouth 3 (three) times daily as needed for anxiety. 01/05/22   [provider]  fluticasone  (FLONASE ) 50 MCG/ACT nasal spray Place 1 spray into both nostrils daily as needed for allergies or rhinitis.    [provider]  gabapentin  (NEURONTIN ) 600 MG tablet Take 600 mg  by mouth 2 (two) times daily. Patient not taking: Reported on 03/11/2024 01/14/22   [provider]  hydrOXYzine (ATARAX) 25 MG tablet Take 25 mg by mouth. Patient not taking: Reported on 03/11/2024 09/18/23   [provider]  methylPREDNISolone  (MEDROL  DOSEPAK) 4 MG TBPK tablet Take by mouth daily. Day 1:  2 pills at breakfast, 1 pill at lunch, 1 pill after supper, 2 pills at bedtime;Day 2:  1 pill at breakfast, 1 pill at lunch, 1 pill after supper, 2 pills at bedtime;Day 3:  1 pill at breakfast, 1 pill at lunch, 1 pill after supper, 1 pill at bedtime;Day 4:  1 pill at breakfast, 1 pill at lunch, 1 pill at bedtime;Day 5:  1 pill at breakfast, 1 pill at bedtime;Day 6:  1 pill at breakfast Patient not taking: Reported on 03/11/2024 09/25/23   Dean Clarity, MD  oxycodone  (OXY-IR) 5 MG capsule Take 1 capsule (5 mg total) by mouth every 4 (four) hours as needed (severe breakthrough pain). Patient not taking: Reported on 03/11/2024 04/26/23   Cosentino, Allison R, PA-C  oxyCODONE -acetaminophen  (PERCOCET) 10-325 MG tablet Take 1 tablet by mouth 5 (five) times daily as needed for pain. Patient not taking: Reported on 03/11/2024    [provider]  oxyCODONE -acetaminophen  (PERCOCET/ROXICET) 5-325 MG tablet Take 1 tablet by mouth every 6 (six) hours as needed for severe pain (pain score 7-10). Patient not taking: Reported on 03/11/2024 09/25/23   Dean Clarity, MD  OZEMPIC, 0.25 OR 0.5 MG/DOSE, 2 MG/3ML SOPN SMARTSIG:0.25 Milligram(s) Once a Week Patient not taking: Reported on 03/11/2024 10/25/23   [provider]  umeclidinium-vilanterol (ANORO ELLIPTA ) 62.5-25 MCG/ACT AEPB Inhale 1 puff into the lungs daily. Patient not taking: Reported on 04/14/2023 02/23/22   Kara Dorn NOVAK, MD  WEGOVY 0.25 MG/0.5ML SOAJ SMARTSIG:0.25 Milligram(s) SUB-Q Once a Week Patient not taking: Reported on 03/11/2024    [provider]  WEGOVY 1.7 MG/0.75ML SOAJ SMARTSIG:1 pre-filled pen  syringe Once a Week 03/03/24   [provider]    Allergies: Ketorolac, Penicillins, and Ketorolac tromethamine    Review of Systems  Musculoskeletal:  Positive for back pain.    Updated Vital Signs BP 106/66 (BP Location: Right Arm)   Pulse (!) 105   Temp 98.3 F (36.8 C) (Oral)   Resp 20   Ht 5' 1 (1.549 m)   Wt 86.2 kg   SpO2 93%   BMI 35.90 kg/m   Physical Exam Vitals and nursing note reviewed.  Constitutional:      General: She is not in acute distress.    Appearance: Normal appearance. She is not diaphoretic.  HENT:     Head: Normocephalic and atraumatic. No raccoon eyes or Battle's sign.     Comments: No hematoma nor TTP of cranium No crepitus to facial bones    Right Ear: External ear normal. No hemotympanum.  Left Ear: External ear normal. No hemotympanum.     Nose: Nose normal.     Right Nostril: No epistaxis or septal hematoma.     Left Nostril: No epistaxis or septal hematoma.     Mouth/Throat:     Mouth: Mucous membranes are moist. No injury or lacerations.   Eyes:     General: Lids are normal. Vision grossly intact. No visual field deficit.       Right eye: No discharge.        Left eye: No discharge.     Extraocular Movements: Extraocular movements intact.     Right eye: Normal extraocular motion and no nystagmus.     Left eye: Normal extraocular motion and no nystagmus.     Conjunctiva/sclera: Conjunctivae normal.     Pupils: Pupils are equal, round, and reactive to light.     Comments: No subconjunctival hemorrhage, hyphema, tear drop pupil, or fluid leakage bilaterally  Neck:     Vascular: No carotid bruit.   Cardiovascular:     Rate and Rhythm: Normal rate.     Pulses: Normal pulses.          Radial pulses are 2+ on the right side and 2+ on the left side.       Dorsalis pedis pulses are 2+ on the right side and 2+ on the left side.  Pulmonary:     Effort: Pulmonary effort is normal. No respiratory distress.     Breath sounds:  Examination of the right-middle field reveals decreased breath sounds. Examination of the right-lower field reveals decreased breath sounds. Decreased breath sounds present. No wheezing.  Chest:     Chest wall: Tenderness present.  Abdominal:     General: Bowel sounds are normal. There is no distension.     Palpations: Abdomen is soft.     Tenderness: There is no abdominal tenderness. There is no guarding or rebound.   Musculoskeletal:     Cervical back: Full passive range of motion without pain, normal range of motion and neck supple. No deformity, rigidity or bony tenderness. Normal range of motion.     Thoracic back: Bony tenderness present. No deformity. Normal range of motion.     Lumbar back: No deformity or bony tenderness. Normal range of motion.     Right hip: No bony tenderness or crepitus.     Left hip: No bony tenderness or crepitus.     Right lower leg: 1+ Pitting Edema present.     Left lower leg: 1+ Pitting Edema present.     Comments: No obvious deformity to joints or long bones Pelvis stable with no shortening or rotation of LE bilaterally   Skin:    General: Skin is warm and dry.     Capillary Refill: Capillary refill takes less than 2 seconds.     Coloration: Skin is not jaundiced or pale.   Neurological:     General: No focal deficit present.     Mental Status: She is alert and oriented to person, place, and time. Mental status is at baseline.     GCS: GCS eye subscore is 4. GCS verbal subscore is 5. GCS motor subscore is 6.     Cranial Nerves: Cranial nerves 2-12 are intact. No cranial nerve deficit, dysarthria or facial asymmetry.     Sensory: Sensation is intact. No sensory deficit.     Motor: Motor function is intact. No weakness, tremor, abnormal muscle tone, seizure activity or pronator drift.  Coordination: Coordination is intact. Coordination normal. Finger-Nose-Finger Test and Heel to [facility] Test normal.     Gait: Gait is intact. Gait normal.     Deep  Tendon Reflexes: Reflexes are normal and symmetric. Reflexes normal.     Comments: A&Ox3. Grip strength equal .following commands appropriately. Sensation 2/2 and motor 5/5 of BUE and BLE    (all labs ordered are listed, but only abnormal results are displayed) Labs Reviewed  CBC WITH DIFFERENTIAL/PLATELET - Abnormal; Notable for the following components:      Result Value   WBC 13.7 (*)    RBC 2.92 (*)    Hemoglobin 9.2 (*)    HCT 29.0 (*)    RDW 16.1 (*)    Neutro Abs 8.2 (*)    Eosinophils Absolute 0.9 (*)    All other components within normal limits  BASIC METABOLIC PANEL WITH GFR - Abnormal; Notable for the following components:   Creatinine, Ser 1.33 (*)    GFR, Estimated 48 (*)    All other components within normal limits  BRAIN NATRIURETIC PEPTIDE  I-STAT CG4 LACTIC ACID, ED  I-STAT CG4 LACTIC ACID, ED  TROPONIN I (HIGH SENSITIVITY)    EKG: EKG Interpretation Date/Time:  Monday March 11 2024 17:56:54 EDT Ventricular Rate:  103 PR Interval:  140 QRS Duration:  94 QT Interval:  339 QTC Calculation: 444 R Axis:   31  Text Interpretation: Sinus tachycardia Low voltage, precordial leads Baseline wander in lead(s) V2 Confirmed by Cottie Cough [phone]) on 03/11/2024 8:37:57 PM  Radiology: [facility] Pelvis Portable Result Date: 03/11/2024 CLINICAL DATA:  Right hip pain fell out of bed EXAM: PORTABLE PELVIS 1-2 VIEWS COMPARISON:  None Available. FINDINGS: Hardware in the lumbosacral spine. SI joints are patent. Pubic symphysis and rami appear intact. No fracture or malalignment. IMPRESSION: No acute osseous abnormality. Electronically Signed   By: Luke Bun M.D.   On: 03/11/2024 21:12   DG Thoracic Spine 2 View Result Date: 03/11/2024 CLINICAL DATA:  Upper rib pain fell out of bed EXAM: THORACIC SPINE 2 VIEWS COMPARISON:  None Available. FINDINGS: Alignment is normal. Vertebral body heights are maintained. Minimal degenerative osteophytes IMPRESSION: Minimal degenerative  change. Electronically Signed   By: Luke Bun M.D.   On: 03/11/2024 21:11   CT Head Wo Contrast Result Date: 03/11/2024 EXAM: CT HEAD AND CERVICAL SPINE 03/11/2024 08:58:03 PM TECHNIQUE: CT of the head and cervical spine was performed without the administration of intravenous contrast. Multiplanar reformatted images are provided for review. Automated exposure control, iterative reconstruction, and/or weight based adjustment of the mA/kV was utilized to reduce the radiation dose to as low as reasonably achievable. COMPARISON: None available. CLINICAL HISTORY: Head trauma, moderate-severe. Patient reports increased episodes of shortness of breath for 3 days. Cannot identify a trigger for the change. Pulse ox 93-96% in triage with unlabored breaths. Not in acute distress. Patient reports she has COPD and remains an active smoker. Concerned because she started coughing up blood last night. Fall, head and neck trauma. FINDINGS: CT HEAD BRAIN AND VENTRICLES: No acute intracranial hemorrhage. No mass effect or midline shift. No abnormal extra-axial fluid collection. Gray-white differentiation is maintained. No hydrocephalus. ORBITS: No acute abnormality. SINUSES AND MASTOIDS: Partial opacification of the bilateral maxillary sinuses. Mastoid air cells are clear. SOFT TISSUES AND SKULL: No acute skull fracture. No acute soft tissue abnormality. CT CERVICAL SPINE BONES AND ALIGNMENT: No acute fracture or traumatic malalignment. DEGENERATIVE CHANGES: No significant degenerative changes. SOFT TISSUES: No prevertebral soft tissue  swelling. IMPRESSION: 1. No acute intracranial abnormality. 2. No traumatic injury to the cervical spine. Electronically signed by: Pinkie Pebbles MD 03/11/2024 09:01 PM EDT RP Workstation: [id number]   CT Cervical Spine Wo Contrast Result Date: 03/11/2024 EXAM: CT HEAD AND CERVICAL SPINE 03/11/2024 08:58:03 PM TECHNIQUE: CT of the head and cervical spine was performed without the  administration of intravenous contrast. Multiplanar reformatted images are provided for review. Automated exposure control, iterative reconstruction, and/or weight based adjustment of the mA/kV was utilized to reduce the radiation dose to as low as reasonably achievable. COMPARISON: None available. CLINICAL HISTORY: Head trauma, moderate-severe. Patient reports increased episodes of shortness of breath for 3 days. Cannot identify a trigger for the change. Pulse ox 93-96% in triage with unlabored breaths. Not in acute distress. Patient reports she has COPD and remains an active smoker. Concerned because she started coughing up blood last night. Fall, head and neck trauma. FINDINGS: CT HEAD BRAIN AND VENTRICLES: No acute intracranial hemorrhage. No mass effect or midline shift. No abnormal extra-axial fluid collection. Gray-white differentiation is maintained. No hydrocephalus. ORBITS: No acute abnormality. SINUSES AND MASTOIDS: Partial opacification of the bilateral maxillary sinuses. Mastoid air cells are clear. SOFT TISSUES AND SKULL: No acute skull fracture. No acute soft tissue abnormality. CT CERVICAL SPINE BONES AND ALIGNMENT: No acute fracture or traumatic malalignment. DEGENERATIVE CHANGES: No significant degenerative changes. SOFT TISSUES: No prevertebral soft tissue swelling. IMPRESSION: 1. No acute intracranial abnormality. 2. No traumatic injury to the cervical spine. Electronically signed by: Pinkie Pebbles MD 03/11/2024 09:01 PM EDT RP Workstation: [id number]   DG Ribs Unilateral Right Result Date: 03/11/2024 EXAM: 3 VIEW(S) XRAY OF THE RIGHT RIBS 03/11/2024 06:49:00 PM COMPARISON: Chest radiograph dated 01/25/2022. CLINICAL HISTORY: 809823 Fall [id number]. Pain to posterior right ribs s/p rolling out of bed and hitting back on a step stool last night. Pain score 9/10. Pt reports a stronger cough than normal, lightheadedness, and mild hemoptysis after fall as well. Hx of COPD. FINDINGS: BONES: No  displaced right rib fracture is seen. LUNGS AND PLEURA: Mild faint patchy opacities in the right upper and lower lobes, suspicious for aspiration/pneumonia. IMPRESSION: 1. No acute right rib fracture. 2. Mild faint patchy opacities in the right upper and lower lobes, suspicious for aspiration/pneumonia. Electronically signed by: Pinkie Pebbles MD 03/11/2024 07:00 PM EDT RP Workstation: [id number]     Medications Ordered in the ED  azithromycin  (ZITHROMAX ) 500 mg in sodium chloride  0.9 % 250 mL IVPB (0 mg Intravenous Stopped 03/12/24 0041)  doxycycline  (VIBRA -TABS) tablet 100 mg (100 mg Oral Given 03/11/24 2019)  morphine  (PF) 4 MG/ML injection 4 mg (4 mg Intravenous Given 03/11/24 2009)  HYDROmorphone  (DILAUDID ) injection 1 mg (1 mg Intravenous Given 03/11/24 2154)  lip balm (CARMEX) ointment ( Topical Given 03/11/24 2314)  HYDROmorphone  (DILAUDID ) injection 0.5 mg (0.5 mg Intravenous Given 03/12/24 0039)    Clinical Course as of 03/12/24 2344  Mon Mar 11, 2024  2323 Creatinine(!): 1.33 1.30 six months ago [LB]    Clinical Course User Index [LB] Minnie Tinnie BRAVO, PA                                 Medical Decision Making Amount and/or Complexity of Data Reviewed Labs: ordered. Decision-making details documented in ED Course. Radiology: ordered.  Risk OTC drugs. Prescription drug management.   Patient presents to the ED for concern of shortness of breath, chest pain, mid back pain,  fall, hemoptysis, this involves an extensive number of treatment options, and is a complaint that carries with it a high risk of complications and morbidity.  The differential diagnosis includes ACS, pneumonia, infection, fluid overload, COPD exacerbation, fracture, contusion, abrasion, dislocation, symptomatic edema   Co morbidities that complicate the patient evaluation  See HPI   Additional history obtained:  Additional history obtained from Nursing and Outside Medical Records   External records  from outside source obtained and reviewed including triage note   Lab Tests:  I Ordered, and personally interpreted labs.  The pertinent results include:   Creatinine 1.33 (baseline 1.35 months ago) BNP 40 Lactic acid 1.7 Troponin negative x 2 WBC 13.7 Hemoglobin 9.2   Imaging Studies ordered:  I ordered imaging studies including chest x-ray, CT head, cervical spine, pelvis x-ray, thoracic x-ray I independently visualized and interpreted imaging which showed no traumatic injury, ICH but mild faint opacities in the right upper and lower lobe suspicion for aspiration/pneumonia I agree with the radiologist interpretation   Cardiac Monitoring:  The patient was maintained on a cardiac monitor.  I personally viewed and interpreted the cardiac monitored which showed an underlying rhythm of: Sinus tachycardia at 103 bpm   Medicines ordered and prescription drug management:  I ordered medication including azithromycin , Doxy, Levaquin, Zofran , Dilaudid  for pneumonia, pain, nausea Reevaluation of the patient after these medicines showed that the patient improved I have reviewed the patients home medicines and have made adjustments as needed     Problem List / ED Course:  CP Troponin negative x 2.  EKG shows sinus tachycardia with no ST nor T wave abnormalities Pain worsened with palpation coughing and improved with morphine , Right-sided pneumonia Does meet sepsis criteria with mild leukocytosis of 13.7 and mild tachycardia 97-105bpm however patient prefers to be DC vs admitted at this time Provided IV azithromycin  as well as Levaquin prescription as patient is anaphylactically allergic to penicillin No signs of respiratory distress.  Maintains oxygen saturation without difficulty. Ambulated wo o2 with sats in 98-99% no severe resp distress nor tachycardia with ambulation Discussed strict return precautions (CP, SHOB, hypoxia, intractable vomiting, etc). Encouraged patient to buy  pulse ox and educated patient on how to appropriately use pulse ox. I Discussed importance of following up with PCP this week following antibiotics to ensure improvement as well as repeat CXR in a couple weeks to ensure improvement Fall No signs of basilar skull fracture on physical exam however did obtain CT head, cervical spine to ensure no ICH which were fortunately negative X-ray of right ribs, thoracic spine, pelvis were negative for fracture Patient is able to ambulate though difficulty and per her neurological baseline A&Ox3 Fungal skin infection Prior to DC, patient requests skin underneath breasts to be evaluated Appears to be candidiasis with satellite lesions. No signs of cellulitis, streaking of skin, breast tenderness, nor nipple dc Provided prescription for antifungal cream and for pt to f.u with PCP   Reevaluation:  After the interventions noted above, I reevaluated the patient and found that they have :improved     Dispostion:  After consideration of the diagnostic results and the patients response to treatment, I feel that the patent would benefit from outpatient management with return for admission if symptoms worsen or if she fails outpatient treatment.  Also needs to follow-up with primary care provider next week to ensure that patient is feeling better as well as repeat x-ray to ensure resolution within the next few weeks.  Discussed ED workup,  disposition, return to ED precautions with patient who expresses understanding agrees with plan.  All questions answered to their satisfaction.  They are agreeable to plan.  Discharge instructions provided on paperwork   Final diagnoses:  Pneumonia of right lung due to infectious organism, unspecified part of lung  Candidiasis of breast  Fall, initial encounter    ED Discharge Orders          Ordered    clotrimazole (LOTRIMIN) 1 % cream        03/12/24 0023    levofloxacin (LEVAQUIN) 750 MG tablet  Daily         03/12/24 0023    ondansetron  (ZOFRAN -ODT) 4 MG disintegrating tablet  Every 8 hours PRN        03/12/24 0023               Minnie Tinnie BRAVO, PA 03/12/24 2346    Cottie Donnice PARAS, MD 03/15/24 [phone]

## 2024-03-11 NOTE — ED Provider Notes (Signed)
 Patient presents to urgent care today for evaluation of increased shortness of breath, significant pain with deep breathing, and coughing up blood that has worsened over the last 3 days. She has COPD at baseline but states she fell prior to developing this pain and worsening symptoms. Given hemoptysis, dyspnea, that she be seen in ED immediately. Patient is agreeable and will have her husband who is here with her transport her via POV.    Vernestine Gondola, PA-C 03/11/24 [phone]

## 2024-03-11 NOTE — ED Notes (Signed)
 Pt complaining of stabbing back pain. Provider made aware.

## 2024-03-12 MED ORDER — HYDROMORPHONE HCL 1 MG/ML IJ SOLN
0.5000 mg | Freq: Once | INTRAMUSCULAR | Status: AC
  Administered 2024-03-12: 0.5 mg via INTRAVENOUS
  Filled 2024-03-12: qty 1

## 2024-03-12 MED ORDER — CLOTRIMAZOLE 1 % EX CREA: TOPICAL_CREAM | CUTANEOUS | 0 refills | Status: DC

## 2024-03-12 MED ORDER — LEVOFLOXACIN 750 MG PO TABS: 750.0000 mg | ORAL_TABLET | Freq: Every day | ORAL | 0 refills | Status: AC

## 2024-03-12 MED ORDER — ONDANSETRON 4 MG PO TBDP: 4.0000 mg | ORAL_TABLET | Freq: Three times a day (TID) | ORAL | 0 refills | Status: DC | PRN

## 2024-03-12 NOTE — Discharge Instructions (Addendum)
 Return to ED if you experience worsening CP, SHOB  Follow up with PCP next week to ensure improvement of symptmos

## 2024-03-12 NOTE — ED Notes (Signed)
 Pt walked from her room to the bathroom. Pt complained of elevated pain but oxygen saturation remained between 98-100.

## 2024-03-18 ENCOUNTER — Emergency Department (HOSPITAL_COMMUNITY)

## 2024-03-18 ENCOUNTER — Encounter (HOSPITAL_COMMUNITY): Payer: Self-pay

## 2024-03-18 ENCOUNTER — Other Ambulatory Visit: Payer: Self-pay

## 2024-03-18 ENCOUNTER — Emergency Department (HOSPITAL_COMMUNITY)
Admission: EM | Admit: 2024-03-18 | Discharge: 2024-03-18 | Disposition: A | Discharge status: Home / Self Care | Attending: Emergency Medicine | Admitting: Emergency Medicine

## 2024-03-18 DIAGNOSIS — D649 Anemia, unspecified: Secondary | ICD-10-CM | POA: Diagnosis not present

## 2024-03-18 DIAGNOSIS — D72829 Elevated white blood cell count, unspecified: Secondary | ICD-10-CM | POA: Insufficient documentation

## 2024-03-18 DIAGNOSIS — R0602 Shortness of breath: Secondary | ICD-10-CM | POA: Insufficient documentation

## 2024-03-18 DIAGNOSIS — I10 Essential (primary) hypertension: Secondary | ICD-10-CM | POA: Diagnosis not present

## 2024-03-18 DIAGNOSIS — E119 Type 2 diabetes mellitus without complications: Secondary | ICD-10-CM | POA: Diagnosis not present

## 2024-03-18 DIAGNOSIS — R0789 Other chest pain: Secondary | ICD-10-CM | POA: Diagnosis not present

## 2024-03-18 DIAGNOSIS — Z79899 Other long term (current) drug therapy: Secondary | ICD-10-CM | POA: Insufficient documentation

## 2024-03-18 DIAGNOSIS — Z7984 Long term (current) use of oral hypoglycemic drugs: Secondary | ICD-10-CM | POA: Insufficient documentation

## 2024-03-18 LAB — BASIC METABOLIC PANEL WITH GFR
Anion gap: 9 (ref 5–15)
BUN: 16 mg/dL (ref 6–20)
CO2: 24 mmol/L (ref 22–32)
Calcium: 9 mg/dL (ref 8.9–10.3)
Chloride: 104 mmol/L (ref 98–111)
Creatinine, Ser: 0.95 mg/dL (ref 0.44–1.00)
GFR, Estimated: >60 mL/min (ref >60)
Glucose, Bld: 97 mg/dL (ref 70–99)
Potassium: 3.9 mmol/L (ref 3.5–5.1)
Sodium: 137 mmol/L (ref 135–145)

## 2024-03-18 LAB — CBC
HCT: 30.5 % — ABNORMAL LOW (ref 36.0–46.0)
Hemoglobin: 10 g/dL — ABNORMAL LOW (ref 12.0–15.0)
MCH: 32.1 pg (ref 26.0–34.0)
MCHC: 32.8 g/dL (ref 30.0–36.0)
MCV: 97.8 fL (ref 80.0–100.0)
Platelets: 330 10*3/uL (ref 150–400)
RBC: 3.12 MIL/uL — ABNORMAL LOW (ref 3.87–5.11)
RDW: 15.4 % (ref 11.5–15.5)
WBC: 12.8 10*3/uL — ABNORMAL HIGH (ref 4.0–10.5)
nRBC: 0 % (ref 0.0–0.2)

## 2024-03-18 LAB — TROPONIN I (HIGH SENSITIVITY): Troponin I (High Sensitivity): <2 ng/L (ref <18)

## 2024-03-18 MED ORDER — OXYCODONE HCL 5 MG PO TABS
10.0000 mg | ORAL_TABLET | Freq: Once | ORAL | Status: AC
  Administered 2024-03-18: 10 mg via ORAL
  Filled 2024-03-18: qty 2

## 2024-03-18 MED ORDER — OXYCODONE-ACETAMINOPHEN 5-325 MG PO TABS
1.0000 | ORAL_TABLET | Freq: Once | ORAL | Status: AC
  Administered 2024-03-18: 1 via ORAL
  Filled 2024-03-18: qty 1

## 2024-03-18 MED ORDER — IPRATROPIUM-ALBUTEROL 0.5-2.5 (3) MG/3ML IN SOLN
3.0000 mL | Freq: Once | RESPIRATORY_TRACT | Status: AC
  Administered 2024-03-18: 3 mL via RESPIRATORY_TRACT
  Filled 2024-03-18: qty 3

## 2024-03-18 MED ORDER — OXYCODONE HCL 5 MG PO TABS
5.0000 mg | ORAL_TABLET | Freq: Once | ORAL | Status: AC
  Administered 2024-03-18: 5 mg via ORAL
  Filled 2024-03-18: qty 1

## 2024-03-18 MED ORDER — OXYCODONE-ACETAMINOPHEN 10-325 MG PO TABS: 1.0000 | ORAL_TABLET | Freq: Once | ORAL | Status: DC

## 2024-03-18 MED ORDER — IOHEXOL 350 MG/ML SOLN
75.0000 mL | Freq: Once | INTRAVENOUS | Status: AC | PRN
  Administered 2024-03-18: 80 mL via INTRAVENOUS

## 2024-03-18 NOTE — ED Provider Notes (Signed)
 [place] EMERGENCY DEPARTMENT AT [facility] Provider Note   CSN: [id number] Arrival date & time: 03/18/24  9450     Patient presents with: Shortness of Breath  HPI Kelli Juarez is a 53 y.o. female with recent history of pneumonia, hypertension, chronic pain, diabetes presenting for shortness of breath and cough.  She states her shortness of breath cough and now has chest pain has been ongoing for a couple of weeks.  She was diagnosed with pneumonia here at [facility] on June 16 and started on Levaquin.  She states she has been taking as prescribed but her symptoms seem to be worsening.  She denies fever at home but reports that the cough is worse but non productive and she is more short of breath.  She states she is not having chest pain that started about a week ago started on the right side now extending into the left side of her chest.  She reports it hurts worse with coughing but not necessarily deep breathing.  She is also requesting IV pain medication for her chest pain stating that oral pain medicine does not work.    Shortness of Breath      Prior to Admission medications   Medication Sig Start Date End Date Taking? Authorizing Provider  albuterol  (PROVENTIL ) (2.5 MG/3ML) 0.083% nebulizer solution Take 3 mLs (2.5 mg total) by nebulization every 6 (six) hours as needed for wheezing or shortness of breath. 02/23/22   Kara Dorn NOVAK, MD  amphetamine -dextroamphetamine  (ADDERALL) 15 MG tablet Take 15 mg by mouth daily. 01/06/22   [provider]  atorvastatin (LIPITOR) 20 MG tablet Take 20 mg by mouth. 11/07/23   [provider]  busPIRone  (BUSPAR ) 15 MG tablet Take 15 mg by mouth in the morning, at noon, in the evening, and at bedtime.    [provider]  cloNIDine (CATAPRES) 0.1 MG tablet Take 0.1 mg by mouth 2 (two) times daily. Patient not taking: Reported on 03/11/2024 09/18/23   [provider]  clotrimazole (LOTRIMIN) 1 %  cream Apply to affected area 2 times daily 03/12/24   Minnie Tinnie BRAVO, PA  cyclobenzaprine  (FLEXERIL ) 10 MG tablet Take 1 tablet (10 mg total) by mouth 3 (three) times daily as needed for muscle spasms. 04/26/23   Cosentino, Allison R, PA-C  diazepam  (VALIUM ) 2 MG tablet Take 2 mg by mouth daily as needed. 02/25/24   [provider]  diazepam  (VALIUM ) 5 MG tablet Take 2.5 mg by mouth 3 (three) times daily as needed for anxiety. 01/05/22   [provider]  FLUoxetine  (PROZAC ) 40 MG capsule Take 80 mg by mouth daily.    [provider]  fluticasone  (FLONASE ) 50 MCG/ACT nasal spray Place 1 spray into both nostrils daily as needed for allergies or rhinitis.    [provider]  furosemide  (LASIX ) 20 MG tablet Take 1 tablet (20 mg total) by mouth daily as needed. Patient taking differently: Take 20 mg by mouth daily. 02/07/22   Anner Alm ORN, MD  gabapentin  (NEURONTIN ) 600 MG tablet Take 600 mg by mouth 2 (two) times daily. Patient not taking: Reported on 03/11/2024 01/14/22   [provider]  gabapentin  (NEURONTIN ) 800 MG tablet Take 800 mg by mouth every 8 (eight) hours. 03/03/24   [provider]  hydrOXYzine (ATARAX) 25 MG tablet Take 25 mg by mouth. Patient not taking: Reported on 03/11/2024 09/18/23   [provider]  lisinopril  (ZESTRIL ) 40 MG tablet Take 40 mg  by mouth daily. 01/10/22   [provider]  metFORMIN  (GLUCOPHAGE ) 500 MG tablet Take 1 tablet by mouth daily. 12/29/22   [provider]  methylPREDNISolone  (MEDROL  DOSEPAK) 4 MG TBPK tablet Take by mouth daily. Day 1:  2 pills at breakfast, 1 pill at lunch, 1 pill after supper, 2 pills at bedtime;Day 2:  1 pill at breakfast, 1 pill at lunch, 1 pill after supper, 2 pills at bedtime;Day 3:  1 pill at breakfast, 1 pill at lunch, 1 pill after supper, 1 pill at bedtime;Day 4:  1 pill at breakfast, 1 pill at lunch, 1 pill at bedtime;Day 5:  1 pill at breakfast, 1 pill at  bedtime;Day 6:  1 pill at breakfast Patient not taking: Reported on 03/11/2024 09/25/23   Dean Clarity, MD  ondansetron  (ZOFRAN -ODT) 4 MG disintegrating tablet Take 1 tablet (4 mg total) by mouth every 8 (eight) hours as needed for nausea or vomiting. 03/12/24   Minnie Tinnie BRAVO, PA  oxycodone  (OXY-IR) 5 MG capsule Take 1 capsule (5 mg total) by mouth every 4 (four) hours as needed (severe breakthrough pain). Patient not taking: Reported on 03/11/2024 04/26/23   Cosentino, Allison R, PA-C  Oxycodone  HCl 20 MG TABS Take 1 tablet by mouth every 6 (six) hours. 03/07/24   [provider]  oxyCODONE -acetaminophen  (PERCOCET) 10-325 MG tablet Take 1 tablet by mouth 5 (five) times daily as needed for pain. Patient not taking: Reported on 03/11/2024    [provider]  oxyCODONE -acetaminophen  (PERCOCET/ROXICET) 5-325 MG tablet Take 1 tablet by mouth every 6 (six) hours as needed for severe pain (pain score 7-10). Patient not taking: Reported on 03/11/2024 09/25/23   Dean Clarity, MD  OZEMPIC, 0.25 OR 0.5 MG/DOSE, 2 MG/3ML SOPN SMARTSIG:0.25 Milligram(s) Once a Week Patient not taking: Reported on 03/11/2024 10/25/23   [provider]  umeclidinium-vilanterol (ANORO ELLIPTA ) 62.5-25 MCG/ACT AEPB Inhale 1 puff into the lungs daily. Patient not taking: Reported on 04/14/2023 02/23/22   Kara Dorn NOVAK, MD  VENTOLIN  HFA 108 (90 Base) MCG/ACT inhaler Inhale 1-2 puffs into the lungs every 6 (six) hours as needed for wheezing or shortness of breath.    [provider]  WEGOVY 0.25 MG/0.5ML SOAJ SMARTSIG:0.25 Milligram(s) SUB-Q Once a Week Patient not taking: Reported on 03/11/2024    [provider]  WEGOVY 1 MG/0.5ML SOAJ  02/10/24   [provider]  WEGOVY 1.7 MG/0.75ML SOAJ SMARTSIG:1 pre-filled pen syringe Once a Week 03/03/24   [provider]    Allergies: Ketorolac, Penicillins, and Ketorolac tromethamine    Review of Systems  Respiratory:   Positive for shortness of breath.     Updated Vital Signs BP (!) 125/94 (BP Location: Right Arm)   Pulse 85   Temp 98 F (36.7 C) (Oral)   Resp 17   SpO2 99%    Physical Exam   Vitals:   03/18/24 0602 03/18/24 1008  BP: (!) 165/106 (!) 125/94  Pulse: (!) 103 85  Resp: 15 17  Temp: 98.6 F (37 C) 98 F (36.7 C)  SpO2: 97% 99%    CONSTITUTIONAL:  well-appearing, NAD NEURO:  Alert and oriented x 3, CN 3-12 grossly intact EYES:  eyes equal and reactive ENT/NECK:  Supple, no stridor  CARDIO: Tachycardia and regular rhythm, appears well-perfused  PULM:  No respiratory distress, CTAB, no wheezing or rales GI/GU:  non-distended MSK/SPINE:  No gross deformities, no edema, moves all extremities  SKIN:  no rash, atraumatic  *Additional  and/or pertinent findings included in MDM below   (all labs ordered are listed, but only abnormal results are displayed) Labs Reviewed  CBC - Abnormal; Notable for the following components:      Result Value   WBC 12.8 (*)    RBC 3.12 (*)    Hemoglobin 10.0 (*)    HCT 30.5 (*)    All other components within normal limits  BASIC METABOLIC PANEL WITH GFR  TROPONIN I (HIGH SENSITIVITY)    EKG: EKG Interpretation Date/Time:  Monday March 18 2024 06:03:46 EDT Ventricular Rate:  101 PR Interval:  156 QRS Duration:  96 QT Interval:  346 QTC Calculation: 449 R Axis:   50  Text Interpretation: Sinus tachycardia Abnormal R-wave progression, early transition Confirmed by Trine Likes [phone]) on 03/18/2024 6:13:08 AM  Radiology: CT Angio Chest PE W/Cm &/Or Wo Cm Result Date: 03/18/2024 CLINICAL DATA:  Shortness of breath, chest pain, high prob PE suspected EXAM: CT ANGIOGRAPHY CHEST WITH CONTRAST TECHNIQUE: Multidetector CT imaging of the chest was performed using the standard protocol during bolus administration of intravenous contrast. Multiplanar CT image reconstructions and MIPs were obtained to evaluate the vascular anatomy. RADIATION DOSE  REDUCTION: This exam was performed according to the departmental dose-optimization program which includes automated exposure control, adjustment of the mA and/or kV according to patient size and/or use of iterative reconstruction technique. CONTRAST:  80mL OMNIPAQUE IOHEXOL 350 MG/ML SOLN COMPARISON:  None Available. FINDINGS: Cardiovascular: Heart size normal. Trace pericardial fluid. The RV is nondilated. Satisfactory opacification of pulmonary arteries noted, and there is no evidence of pulmonary emboli. Adequate contrast opacification of the thoracic aorta with no evidence of dissection, aneurysm, or stenosis. There is classic 3-vessel brachiocephalic arch anatomy without proximal stenosis. Mediastinum/Nodes: No mediastinal hematoma, mass, or adenopathy. Lungs/Pleura: No pleural effusion. No pneumothorax. Mild dependent atelectasis in the lower lobes. Upper Abdomen: Cholecystectomy clips.  No acute findings. Musculoskeletal: No chest wall abnormality. No acute or significant osseous findings. Review of the MIP images confirms the above findings. IMPRESSION: 1. Negative for acute PE or thoracic aortic dissection. 2. Mild dependent atelectasis in the lower lobes. Electronically Signed   By: JONETTA Faes M.D.   On: 03/18/2024 11:08   DG Chest 2 View Result Date: 03/18/2024 CLINICAL DATA:  chest pain EXAM: CHEST - 2 VIEW COMPARISON:  03/11/2024 FINDINGS: Relatively low lung volumes.  Lungs are clear.  No pneumothorax. Heart size and mediastinal contours are within normal limits. No effusion. Visualized bones unremarkable.  Surgical clips in the upper abdomen IMPRESSION: Low volumes. No acute findings. Electronically Signed   By: JONETTA Faes M.D.   On: 03/18/2024 06:49     Procedures   Medications Ordered in the ED  ipratropium-albuterol  (DUONEB) 0.5-2.5 (3) MG/3ML nebulizer solution 3 mL (3 mLs Nebulization Given 03/18/24 0731)  oxyCODONE -acetaminophen  (PERCOCET/ROXICET) 5-325 MG per tablet 1 tablet (1 tablet  Oral Given 03/18/24 0842)    And  oxyCODONE  (Oxy IR/ROXICODONE ) immediate release tablet 5 mg (5 mg Oral Given 03/18/24 0841)  iohexol (OMNIPAQUE) 350 MG/ML injection 75 mL (80 mLs Intravenous Contrast Given 03/18/24 0935)  oxyCODONE  (Oxy IR/ROXICODONE ) immediate release tablet 10 mg (10 mg Oral Given 03/18/24 0840)                                    Medical Decision Making Amount and/or Complexity of Data Reviewed Labs: ordered. Radiology: ordered.  Risk Prescription drug management.  Initial Impression and Ddx 53 year old well-appearing female presenting for chest pain and shortness of breath in setting of recent diagnosis of pneumonia and has been on Levaquin.  Exam was unremarkable but did reveal tachycardia.  DDx includes pneumonia, sepsis, PE, ACS, aortic dissection, pneumothorax, other. Patient PMH that increases complexity of ED encounter:  recent history of pneumonia, hypertension, chronic pain, diabetes  Interpretation of Diagnostics - I independent reviewed and interpreted the labs as followed: leukocytosis (but improved from last check, was 13.7 now 12.8), anemia but also improved  - I independently visualized the following imaging with scope of interpretation limited to determining acute life threatening conditions related to emergency care: CT angio, which revealed no acute findings, negative for PE, mild atelectasis.  Chest x-ray was negative for acute findings  -I personally reviewed and interpreted EKG which revealed sinus tachycardia.  Patient Reassessment and Ultimate Disposition/Management Overall, workup was reassuring.  Suggest that pneumonia has resolved and labs are improving and she is afebrile.  Tachycardia improved after oral pain meds given.  Workup does not suggest PE, ACS or pneumonia.  Advised to continue taking her pain medication for chronic pain to treat her symptoms at home.  Advised her to follow-up with her PCP.  Discussed return precautions.   Discharged good condition.  Patient management required discussion with the following services or consulting groups:  None  Complexity of Problems Addressed Acute complicated illness or Injury  Additional Data Reviewed and Analyzed Further history obtained from: Further history from spouse/family member, Past medical history and medications listed in the EMR, and Prior ED visit notes  Patient Encounter Risk Assessment Consideration of hospitalization      Final diagnoses:  SOB (shortness of breath)  Atypical chest pain    ED Discharge Orders     None          Lang Norleen POUR, PA-C 03/18/24 1136    Cardama, Raynell Moder, MD 03/19/24 [phone]

## 2024-03-18 NOTE — ED Triage Notes (Signed)
 Pt is coming in for continued shortness of breath with chest pain that has been going on for awhile not, she was Dx with pneumonia 1 week ago at her last hospital visit and she was sent home with oral abx and told to return if sh eid not feel better. She is coming in today due to not feeling better with increased chest pain that accompanies the [facility]. She is mildly labored in her breathing while at rest and seemingly very uncomfortable.

## 2024-03-18 NOTE — Discharge Instructions (Addendum)
 Evaluation today was reassuring and actually demonstrated that your pneumonia has resolved and your labs are improving.  For your ongoing chest pain recommend that you take your pain medications that you take for chronic pain and follow-up with your PCP.  If you develop worsening chest pain, shortness of breath, cough and fever or any other concerning symptom please return to the ED for further evaluation.  Also if you have not finished your antibiotic course recommend that you continue to take it until you have completed the course.

## 2024-05-21 DIAGNOSIS — M6289 Other specified disorders of muscle: Secondary | ICD-10-CM | POA: Insufficient documentation

## 2024-07-17 ENCOUNTER — Encounter: Payer: Self-pay | Admitting: Emergency Medicine

## 2024-07-17 ENCOUNTER — Ambulatory Visit (INDEPENDENT_AMBULATORY_CARE_PROVIDER_SITE_OTHER)

## 2024-07-17 ENCOUNTER — Ambulatory Visit
Admission: EM | Admit: 2024-07-17 | Discharge: 2024-07-17 | Disposition: A | Discharge status: Home / Self Care | Attending: Family Medicine | Admitting: Family Medicine

## 2024-07-17 DIAGNOSIS — M25531 Pain in right wrist: Secondary | ICD-10-CM

## 2024-07-17 MED ORDER — CELECOXIB 200 MG PO CAPS: 200.0000 mg | ORAL_CAPSULE | Freq: Every day | ORAL | 0 refills | Status: DC | PRN

## 2024-07-17 NOTE — Discharge Instructions (Addendum)
 There were no broken bones on your x-rays.  Celecoxib 200 mg--1 daily for pain.  This does not interact with your other medications.  Ice and elevate your sore wrist.  Follow-up with your primary care
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
## 2024-07-17 NOTE — ED Triage Notes (Addendum)
 Pt reports right wrist pain after injury yesterday. Reports her foot got caught in her comforter and she fell landing on R wrist. Lateral side of R wrist has swelling, bruising, redness, and tender to touch. Pt is on oxycodone  at baseline and still has severe pain in the wrist, 7/10. Pain is radiating up into forearm and fingers. Has applied voltaren gel as well as heat and ice with little relief. Reports not really moving the area since incident due to pain, just keeps it in stationary position.

## 2024-08-16 ENCOUNTER — Institutional Professional Consult (permissible substitution)

## 2024-08-20 ENCOUNTER — Emergency Department (HOSPITAL_COMMUNITY)
Admission: EM | Admit: 2024-08-20 | Discharge: 2024-08-20 | Disposition: A | Discharge status: Home / Self Care | Attending: Emergency Medicine | Admitting: Emergency Medicine

## 2024-08-20 ENCOUNTER — Other Ambulatory Visit: Payer: Self-pay

## 2024-08-20 DIAGNOSIS — I87323 Chronic venous hypertension (idiopathic) with inflammation of bilateral lower extremity: Secondary | ICD-10-CM | POA: Insufficient documentation

## 2024-08-20 DIAGNOSIS — R2243 Localized swelling, mass and lump, lower limb, bilateral: Secondary | ICD-10-CM | POA: Diagnosis present

## 2024-08-20 DIAGNOSIS — R5381 Other malaise: Secondary | ICD-10-CM | POA: Diagnosis not present

## 2024-08-20 DIAGNOSIS — I872 Venous insufficiency (chronic) (peripheral): Secondary | ICD-10-CM

## 2024-08-20 DIAGNOSIS — I878 Other specified disorders of veins: Secondary | ICD-10-CM

## 2024-08-20 NOTE — ED Triage Notes (Signed)
 PT arrives to triage via wheelchair. PT has complaints of bilateral leg swelling that has worsened since Friday. PT states that she has excruciating pain when standing. Pt denies missing any doses of diuretics.

## 2024-08-20 NOTE — Discharge Instructions (Addendum)
 1.  Elevate your legs at all times when you are not up and active. 2.  Work with your primary doctor to get a referral for appropriately fitted compression socks. 3.  Gradually work towards a healthier lifestyle with regular exercise in short episodes initially.  As your physical condition improves, you may increase your exercise tolerance.  Regular light activity is better than no activity, or sudden heavy exercise that causes you increased joint injury or pain. 4.  Chronic venous stasis and stasis dermatitis are long-term problems.  They require careful management and only really improve with lifestyle changes and daily care.  You must quit smoking.  Often people are unaware that in addition to complications like lung cancer, COPD or emphysema, smoking damages the small vessels throughout your body increasing risk of other chronic medical conditions.

## 2024-08-20 NOTE — ED Provider Notes (Signed)
 [place] EMERGENCY DEPARTMENT AT [facility] Provider Note   CSN: [id number] Arrival date & time: 08/20/24  9083     Patient presents with: Leg Swelling and Leg Pain   Kelli SHANNAHAN is a 53 y.o. female.   HPI Patient reports increased swelling of both lower extremities.  Patient reports history of periodic swelling of the legs but usually improves after a few days.  She has been treated long-term with daily Lasix  for lower extremity swelling.  Patient reports that she urinates a lot.  Over the past few days however the swelling has been persistent and has a itchy and somewhat painful rash around the lower legs.  Patient reports that she is chronically short of breath and has not had any changes.  No associated chest pain.  Patient reports that the swelling is better in the mornings and with elevation.  Currently it is better than it had been several days ago.  She did show me images from a few days ago when the swelling was larger.  Patient reports has been treated long-term with Lasix  for lower extremity swelling but denies ever understanding why she had the problem or a specific diagnosis.    Prior to Admission medications   Medication Sig Start Date End Date Taking? Authorizing Provider  albuterol  (PROVENTIL ) (2.5 MG/3ML) 0.083% nebulizer solution Take 3 mLs (2.5 mg total) by nebulization every 6 (six) hours as needed for wheezing or shortness of breath. 02/23/22   Kara Dorn NOVAK, MD  amphetamine -dextroamphetamine  (ADDERALL) 15 MG tablet Take 15 mg by mouth daily. 01/06/22   [provider]  atorvastatin (LIPITOR) 20 MG tablet Take 20 mg by mouth. 11/07/23   [provider]  busPIRone  (BUSPAR ) 15 MG tablet Take 15 mg by mouth in the morning, at noon, in the evening, and at bedtime.    [provider]  celecoxib  (CELEBREX ) 200 MG capsule Take 1 capsule (200 mg total) by mouth daily as needed (pain). 07/17/24   Banister, Pamela K, MD  cloNIDine  (CATAPRES) 0.1 MG tablet Take 0.1 mg by mouth 2 (two) times daily. Patient not taking: Reported on 03/11/2024 09/18/23   [provider]  clotrimazole  (LOTRIMIN ) 1 % cream Apply to affected area 2 times daily Patient not taking: Reported on 07/17/2024 03/12/24   Minnie Tinnie BRAVO, PA  cyclobenzaprine  (FLEXERIL ) 10 MG tablet Take 1 tablet (10 mg total) by mouth 3 (three) times daily as needed for muscle spasms. 04/26/23   Cosentino, Allison R, PA-C  diazepam  (VALIUM ) 2 MG tablet Take 2 mg by mouth daily as needed. 02/25/24   [provider]  diazepam  (VALIUM ) 5 MG tablet Take 2.5 mg by mouth 3 (three) times daily as needed for anxiety. Patient not taking: Reported on 07/17/2024 01/05/22   [provider]  FLUoxetine  (PROZAC ) 40 MG capsule Take 80 mg by mouth daily.    [provider]  fluticasone  (FLONASE ) 50 MCG/ACT nasal spray Place 1 spray into both nostrils daily as needed for allergies or rhinitis. Patient not taking: Reported on 07/17/2024    [provider]  furosemide  (LASIX ) 20 MG tablet Take 1 tablet (20 mg total) by mouth daily as needed. Patient taking differently: Take 20 mg by mouth daily. 02/07/22   Anner Alm ORN, MD  gabapentin  (NEURONTIN ) 600 MG tablet Take 600 mg by mouth 2 (two) times daily. Patient not taking: Reported on 03/11/2024 01/14/22   [provider]  gabapentin  (NEURONTIN ) 800 MG tablet Take 800 mg  by mouth every 8 (eight) hours. 03/03/24   [provider]  hydrOXYzine (ATARAX) 25 MG tablet Take 25 mg by mouth. Patient not taking: Reported on 03/11/2024 09/18/23   [provider]  lisinopril  (ZESTRIL ) 40 MG tablet Take 40 mg by mouth daily. 01/10/22   [provider]  metFORMIN  (GLUCOPHAGE ) 500 MG tablet Take 1 tablet by mouth daily. 12/29/22   [provider]  ondansetron  (ZOFRAN -ODT) 4 MG disintegrating tablet Take 1 tablet (4 mg total) by mouth every 8 (eight) hours as needed for nausea or  vomiting. Patient not taking: Reported on 07/17/2024 03/12/24   Minnie Tinnie BRAVO, PA  oxycodone  (OXY-IR) 5 MG capsule Take 1 capsule (5 mg total) by mouth every 4 (four) hours as needed (severe breakthrough pain). Patient not taking: Reported on 03/11/2024 04/26/23   Cosentino, Allison R, PA-C  Oxycodone  HCl 20 MG TABS Take 1 tablet by mouth every 6 (six) hours. 03/07/24   [provider]  oxyCODONE -acetaminophen  (PERCOCET) 10-325 MG tablet Take 1 tablet by mouth 5 (five) times daily as needed for pain. Patient not taking: Reported on 03/11/2024    [provider]  oxyCODONE -acetaminophen  (PERCOCET/ROXICET) 5-325 MG tablet Take 1 tablet by mouth every 6 (six) hours as needed for severe pain (pain score 7-10). Patient not taking: Reported on 03/11/2024 09/25/23   Dean Clarity, MD  [facility], 0.25 OR 0.5 MG/DOSE, 2 MG/3ML [facility]  10/25/23   [provider]  umeclidinium-vilanterol (ANORO ELLIPTA ) 62.5-25 MCG/ACT AEPB Inhale 1 puff into the lungs daily. Patient not taking: Reported on 04/14/2023 02/23/22   Kara Dorn NOVAK, MD  VENTOLIN  HFA 108 [phone] Base) MCG/ACT inhaler Inhale 1-2 puffs into the lungs every 6 (six) hours as needed for wheezing or shortness of breath.    [provider]  WEGOVY 0.25 MG/0.5ML SOAJ SMARTSIG:0.25 Milligram(s) SUB-Q Once a Week Patient not taking: Reported on 03/11/2024    [provider]  WEGOVY 1 MG/0.5ML SOAJ  02/10/24   [provider]  WEGOVY 1.7 MG/0.75ML SOAJ SMARTSIG:1 pre-filled pen syringe Once a Week 03/03/24   [provider]    Allergies: Ketorolac, Ketorolac tromethamine, and Penicillins    Review of Systems  Updated Vital Signs BP (!) 140/63   Pulse 96   Temp 98 F (36.7 C) (Oral)   Resp 18   SpO2 100%   Physical Exam Constitutional:      Comments: Alert nontoxic.  Clear mental status.  No respiratory distress at rest.  HENT:     Mouth/Throat:     Pharynx: Oropharynx is clear.  Eyes:      Extraocular Movements: Extraocular movements intact.  Cardiovascular:     Rate and Rhythm: Normal rate and regular rhythm.     Heart sounds: Normal heart sounds.  Pulmonary:     Effort: Pulmonary effort is normal.     Breath sounds: Normal breath sounds.  Abdominal:     General: There is no distension.     Palpations: Abdomen is soft.     Tenderness: There is no abdominal tenderness. There is no guarding.     Comments: Bilateral inguinal areas nontender.  Extremely mild candidal rash deep in groin fold.  Generally normal groin folds beneath pannus.  No mass or fullness in left or right inguinal region.  Musculoskeletal:     Comments: Patient has 2-3+ edema bilateral lower extremities symmetrically.  Feet have symmetric edema and fullness of the dorsums.  Feet are warm and dry to the touch.  No open wounds.  Patient has a mild, fine erythematous rash of the lower legs. See attached image.  Skin:    General: Skin is warm and dry.  Neurological:     General: No focal deficit present.     Mental Status: She is oriented to person, place, and time.     Motor: No weakness.     Coordination: Coordination normal.  Psychiatric:        Mood and Affect: Mood normal.     (all labs ordered are listed, but only abnormal results are displayed) Labs Reviewed - No data to display  EKG: None  Radiology: No results found.   Procedures   Medications Ordered in the ED - No data to display                                  Medical Decision Making  Patient presents as outlined.  I have done extensive review of EMR.  Patient has documented DVT studies bilateral lower extremities previously negative 6\2023 and cardiac echo 6\2023.  Patient's had long-term treatment for this problem with chronic Lasix  administration without any prior history of congestive heart failure or prior DVT.  Physical exam and history are consistent with chronic venous stasis with gradual worsening over time.  At this time  rash is consistent with stasis dermatitis.  Currently, patient's blood pressure is controlled on lisinopril  and she has diagnosis of prediabetes.  Generally findings are consistent with metabolic syndrome.  Patient also has increased risk factor of smoking.  I had extensive discussion with the patient and the husband at bedside regarding baseline health maintenance and increased risk of chronic venous stasis with gradual worsening over time in this setting.  Talked about acute management involving elevating and appropriately fitted compression socks.  We talked about long-term issues of smoking and physical deconditioning and the fact that gradual improvement of these is likely the only way to substantially improve outcomes in the setting of venous stasis.  Patient and husband voiced understanding.  They voiced plan to immediately contact PCP to assist with compression hose and more short-term management of venous stasis and longer-term goals of smoking cessation and working towards improved physical conditioning.  Patient is discharged in good condition with stable vital signs. At this time I do not feel that further diagnostic imaging or lab work will assist in managing findings for current presentation.     Final diagnoses:  Chronic venous stasis dermatitis of both lower extremities  Venous stasis  Physical deconditioning    ED Discharge Orders     None          Armenta Canning, MD 08/20/24 1101

## 2024-09-13 ENCOUNTER — Other Ambulatory Visit: Payer: Self-pay

## 2024-09-13 ENCOUNTER — Ambulatory Visit

## 2024-09-13 VITALS — BP 126/86 | HR 117 | Ht 61.0 in | Wt 200.6 lb

## 2024-09-13 DIAGNOSIS — Z720 Tobacco use: Secondary | ICD-10-CM | POA: Diagnosis not present

## 2024-09-13 DIAGNOSIS — Z1231 Encounter for screening mammogram for malignant neoplasm of breast: Secondary | ICD-10-CM

## 2024-09-13 DIAGNOSIS — R21 Rash and other nonspecific skin eruption: Secondary | ICD-10-CM

## 2024-09-13 DIAGNOSIS — M545 Low back pain, unspecified: Secondary | ICD-10-CM | POA: Diagnosis not present

## 2024-09-13 DIAGNOSIS — M546 Pain in thoracic spine: Secondary | ICD-10-CM

## 2024-09-13 DIAGNOSIS — N62 Hypertrophy of breast: Secondary | ICD-10-CM

## 2024-09-13 DIAGNOSIS — M542 Cervicalgia: Secondary | ICD-10-CM | POA: Diagnosis not present
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
## 2024-09-17 ENCOUNTER — Encounter (HOSPITAL_COMMUNITY): Payer: Self-pay

## 2024-09-17 ENCOUNTER — Emergency Department (HOSPITAL_COMMUNITY)

## 2024-09-17 ENCOUNTER — Encounter: Payer: Self-pay | Admitting: Emergency Medicine

## 2024-09-17 ENCOUNTER — Other Ambulatory Visit: Payer: Self-pay

## 2024-09-17 ENCOUNTER — Inpatient Hospital Stay (HOSPITAL_COMMUNITY)
Admission: EM | Admit: 2024-09-17 | Discharge: 2024-09-23 | DRG: 871 | Disposition: A | Discharge status: Home / Self Care | Attending: Internal Medicine | Admitting: Internal Medicine

## 2024-09-17 ENCOUNTER — Ambulatory Visit: Admission: EM | Admit: 2024-09-17 | Discharge: 2024-09-17 | Disposition: A | Discharge status: Home / Self Care

## 2024-09-17 DIAGNOSIS — J9601 Acute respiratory failure with hypoxia: Secondary | ICD-10-CM | POA: Diagnosis present

## 2024-09-17 DIAGNOSIS — F4310 Post-traumatic stress disorder, unspecified: Secondary | ICD-10-CM | POA: Diagnosis present

## 2024-09-17 DIAGNOSIS — F419 Anxiety disorder, unspecified: Secondary | ICD-10-CM | POA: Diagnosis present

## 2024-09-17 DIAGNOSIS — J9621 Acute and chronic respiratory failure with hypoxia: Secondary | ICD-10-CM

## 2024-09-17 DIAGNOSIS — G8929 Other chronic pain: Secondary | ICD-10-CM | POA: Diagnosis not present

## 2024-09-17 DIAGNOSIS — Z87442 Personal history of urinary calculi: Secondary | ICD-10-CM

## 2024-09-17 DIAGNOSIS — I10 Essential (primary) hypertension: Secondary | ICD-10-CM | POA: Diagnosis present

## 2024-09-17 DIAGNOSIS — J189 Pneumonia, unspecified organism: Secondary | ICD-10-CM | POA: Diagnosis present

## 2024-09-17 DIAGNOSIS — E872 Acidosis, unspecified: Secondary | ICD-10-CM | POA: Diagnosis present

## 2024-09-17 DIAGNOSIS — R6520 Severe sepsis without septic shock: Secondary | ICD-10-CM | POA: Diagnosis present

## 2024-09-17 DIAGNOSIS — N179 Acute kidney failure, unspecified: Secondary | ICD-10-CM | POA: Diagnosis present

## 2024-09-17 DIAGNOSIS — F17210 Nicotine dependence, cigarettes, uncomplicated: Secondary | ICD-10-CM | POA: Diagnosis present

## 2024-09-17 DIAGNOSIS — R0602 Shortness of breath: Secondary | ICD-10-CM | POA: Diagnosis not present

## 2024-09-17 DIAGNOSIS — E876 Hypokalemia: Secondary | ICD-10-CM | POA: Diagnosis present

## 2024-09-17 DIAGNOSIS — E785 Hyperlipidemia, unspecified: Secondary | ICD-10-CM | POA: Diagnosis present

## 2024-09-17 DIAGNOSIS — T380X5A Adverse effect of glucocorticoids and synthetic analogues, initial encounter: Secondary | ICD-10-CM | POA: Diagnosis not present

## 2024-09-17 DIAGNOSIS — F32A Depression, unspecified: Secondary | ICD-10-CM | POA: Diagnosis present

## 2024-09-17 DIAGNOSIS — I872 Venous insufficiency (chronic) (peripheral): Secondary | ICD-10-CM | POA: Diagnosis present

## 2024-09-17 DIAGNOSIS — Z79899 Other long term (current) drug therapy: Secondary | ICD-10-CM

## 2024-09-17 DIAGNOSIS — Z1152 Encounter for screening for COVID-19: Secondary | ICD-10-CM | POA: Diagnosis not present

## 2024-09-17 DIAGNOSIS — Z716 Tobacco abuse counseling: Secondary | ICD-10-CM

## 2024-09-17 DIAGNOSIS — Z886 Allergy status to analgesic agent status: Secondary | ICD-10-CM

## 2024-09-17 DIAGNOSIS — J441 Chronic obstructive pulmonary disease with (acute) exacerbation: Secondary | ICD-10-CM

## 2024-09-17 DIAGNOSIS — J440 Chronic obstructive pulmonary disease with acute lower respiratory infection: Secondary | ICD-10-CM | POA: Diagnosis present

## 2024-09-17 DIAGNOSIS — F909 Attention-deficit hyperactivity disorder, unspecified type: Secondary | ICD-10-CM | POA: Diagnosis present

## 2024-09-17 DIAGNOSIS — A419 Sepsis, unspecified organism: Principal | ICD-10-CM | POA: Diagnosis present

## 2024-09-17 DIAGNOSIS — Z6837 Body mass index (BMI) 37.0-37.9, adult: Secondary | ICD-10-CM

## 2024-09-17 DIAGNOSIS — M549 Dorsalgia, unspecified: Secondary | ICD-10-CM | POA: Diagnosis not present

## 2024-09-17 DIAGNOSIS — E669 Obesity, unspecified: Secondary | ICD-10-CM | POA: Diagnosis present

## 2024-09-17 DIAGNOSIS — I890 Lymphedema, not elsewhere classified: Secondary | ICD-10-CM | POA: Diagnosis present

## 2024-09-17 DIAGNOSIS — E119 Type 2 diabetes mellitus without complications: Secondary | ICD-10-CM | POA: Diagnosis present

## 2024-09-17 DIAGNOSIS — E860 Dehydration: Secondary | ICD-10-CM | POA: Diagnosis present

## 2024-09-17 DIAGNOSIS — Z880 Allergy status to penicillin: Secondary | ICD-10-CM

## 2024-09-17 DIAGNOSIS — Z7984 Long term (current) use of oral hypoglycemic drugs: Secondary | ICD-10-CM

## 2024-09-17 LAB — POC COVID19/FLU A&B COMBO
Covid Antigen, POC: NEGATIVE
Influenza A Antigen, POC: NEGATIVE
Influenza B Antigen, POC: NEGATIVE

## 2024-09-17 LAB — BASIC METABOLIC PANEL WITH GFR
Anion gap: 15 (ref 5–15)
BUN: 19 mg/dL (ref 6–20)
CO2: 24 mmol/L (ref 22–32)
Calcium: 9 mg/dL (ref 8.9–10.3)
Chloride: 96 mmol/L — ABNORMAL LOW (ref 98–111)
Creatinine, Ser: 1.56 mg/dL — ABNORMAL HIGH (ref 0.44–1.00)
GFR, Estimated: 40 mL/min — ABNORMAL LOW
Glucose, Bld: 132 mg/dL — ABNORMAL HIGH (ref 70–99)
Potassium: 3.4 mmol/L — ABNORMAL LOW (ref 3.5–5.1)
Sodium: 135 mmol/L (ref 135–145)

## 2024-09-17 LAB — CBC
HCT: 29.4 % — ABNORMAL LOW (ref 36.0–46.0)
Hemoglobin: 9.7 g/dL — ABNORMAL LOW (ref 12.0–15.0)
MCH: 30.9 pg (ref 26.0–34.0)
MCHC: 33 g/dL (ref 30.0–36.0)
MCV: 93.6 fL (ref 80.0–100.0)
Platelets: 263 K/uL (ref 150–400)
RBC: 3.14 MIL/uL — ABNORMAL LOW (ref 3.87–5.11)
RDW: 15.2 % (ref 11.5–15.5)
WBC: 20.1 K/uL — ABNORMAL HIGH (ref 4.0–10.5)
nRBC: 0 % (ref 0.0–0.2)

## 2024-09-17 LAB — I-STAT CG4 LACTIC ACID, ED
Lactic Acid, Venous: 3.4 mmol/L (ref 0.5–1.9)
Lactic Acid, Venous: 3.1 mmol/L (ref 0.5–1.9)

## 2024-09-17 LAB — PRO BRAIN NATRIURETIC PEPTIDE: Pro Brain Natriuretic Peptide: 934 pg/mL — ABNORMAL HIGH

## 2024-09-17 LAB — RESP PANEL BY RT-PCR (RSV, FLU A&B, COVID)  RVPGX2
Influenza A by PCR: NEGATIVE
Influenza B by PCR: NEGATIVE
Resp Syncytial Virus by PCR: NEGATIVE
SARS Coronavirus 2 by RT PCR: NEGATIVE

## 2024-09-17 LAB — TROPONIN T, HIGH SENSITIVITY
Troponin T High Sensitivity: <15 ng/L (ref 0–19)
Troponin T High Sensitivity: <15 ng/L (ref 0–19)

## 2024-09-17 MED ORDER — FLUOXETINE HCL 20 MG PO CAPS
40.0000 mg | ORAL_CAPSULE | Freq: Two times a day (BID) | ORAL | Status: DC
  Administered 2024-09-18 – 2024-09-23 (×12): 40 mg via ORAL
  Filled 2024-09-17 (×11): qty 2

## 2024-09-17 MED ORDER — METHYLPREDNISOLONE SODIUM SUCC 125 MG IJ SOLR
80.0000 mg | Freq: Every day | INTRAMUSCULAR | Status: DC
  Administered 2024-09-18 – 2024-09-23 (×6): 80 mg via INTRAVENOUS
  Filled 2024-09-17 (×5): qty 2

## 2024-09-17 MED ORDER — HYDRALAZINE HCL 20 MG/ML IJ SOLN: 10.0000 mg | Freq: Three times a day (TID) | INTRAMUSCULAR | Status: DC | PRN

## 2024-09-17 MED ORDER — POTASSIUM CHLORIDE CRYS ER 20 MEQ PO TBCR
40.0000 meq | EXTENDED_RELEASE_TABLET | Freq: Once | ORAL | Status: AC
  Administered 2024-09-18: 40 meq via ORAL
  Filled 2024-09-17: qty 2

## 2024-09-17 MED ORDER — ONDANSETRON HCL 4 MG/2ML IJ SOLN: 4.0000 mg | Freq: Four times a day (QID) | INTRAMUSCULAR | Status: DC | PRN

## 2024-09-17 MED ORDER — LEVOFLOXACIN IN D5W 750 MG/150ML IV SOLN
750.0000 mg | Freq: Once | INTRAVENOUS | Status: AC
  Administered 2024-09-17: 750 mg via INTRAVENOUS
  Filled 2024-09-17: qty 150

## 2024-09-17 MED ORDER — GABAPENTIN 400 MG PO CAPS
800.0000 mg | ORAL_CAPSULE | Freq: Three times a day (TID) | ORAL | Status: DC
  Administered 2024-09-18 – 2024-09-23 (×17): 800 mg via ORAL
  Filled 2024-09-17 (×16): qty 2

## 2024-09-17 MED ORDER — ACETAMINOPHEN 325 MG PO TABS
650.0000 mg | ORAL_TABLET | Freq: Four times a day (QID) | ORAL | Status: DC | PRN
  Filled 2024-09-17: qty 2

## 2024-09-17 MED ORDER — SENNOSIDES-DOCUSATE SODIUM 8.6-50 MG PO TABS
1.0000 | ORAL_TABLET | Freq: Every evening | ORAL | Status: DC | PRN
  Administered 2024-09-21: 1 via ORAL
  Filled 2024-09-17: qty 1

## 2024-09-17 MED ORDER — IPRATROPIUM-ALBUTEROL 0.5-2.5 (3) MG/3ML IN SOLN: 3.0000 mL | Freq: Four times a day (QID) | RESPIRATORY_TRACT | Status: DC | PRN

## 2024-09-17 MED ORDER — OXYCODONE-ACETAMINOPHEN 5-325 MG PO TABS
1.0000 | ORAL_TABLET | Freq: Once | ORAL | Status: AC
  Administered 2024-09-17: 1 via ORAL
  Filled 2024-09-17: qty 1

## 2024-09-17 MED ORDER — SODIUM CHLORIDE 0.9 % IV SOLN: 500.0000 mg | INTRAVENOUS | Status: DC

## 2024-09-17 MED ORDER — LACTATED RINGERS IV BOLUS (SEPSIS)
500.0000 mL | Freq: Once | INTRAVENOUS | Status: AC
  Administered 2024-09-17: 500 mL via INTRAVENOUS

## 2024-09-17 MED ORDER — CYCLOBENZAPRINE HCL 10 MG PO TABS
10.0000 mg | ORAL_TABLET | Freq: Three times a day (TID) | ORAL | Status: DC | PRN
  Administered 2024-09-18 – 2024-09-20 (×3): 10 mg via ORAL
  Filled 2024-09-17 (×3): qty 1

## 2024-09-17 MED ORDER — ENOXAPARIN SODIUM 40 MG/0.4ML IJ SOSY
40.0000 mg | PREFILLED_SYRINGE | INTRAMUSCULAR | Status: DC
  Administered 2024-09-17 – 2024-09-22 (×6): 40 mg via SUBCUTANEOUS
  Filled 2024-09-17 (×6): qty 0.4

## 2024-09-17 MED ORDER — LEVOFLOXACIN IN D5W 750 MG/150ML IV SOLN: 750.0000 mg | INTRAVENOUS | Status: DC

## 2024-09-17 MED ORDER — OXYCODONE HCL 5 MG PO TABS
40.0000 mg | ORAL_TABLET | Freq: Two times a day (BID) | ORAL | Status: DC
  Administered 2024-09-18 – 2024-09-21 (×8): 40 mg via ORAL
  Filled 2024-09-17 (×8): qty 8

## 2024-09-17 MED ORDER — BISACODYL 5 MG PO TBEC
5.0000 mg | DELAYED_RELEASE_TABLET | Freq: Every day | ORAL | Status: DC | PRN
  Administered 2024-09-18 – 2024-09-22 (×3): 5 mg via ORAL
  Filled 2024-09-17 (×4): qty 1

## 2024-09-17 MED ORDER — PHENTERMINE HCL 37.5 MG PO CAPS: 37.5000 mg | ORAL_CAPSULE | Freq: Every morning | ORAL | Status: DC

## 2024-09-17 MED ORDER — IPRATROPIUM-ALBUTEROL 0.5-2.5 (3) MG/3ML IN SOLN
3.0000 mL | Freq: Once | RESPIRATORY_TRACT | Status: AC
  Administered 2024-09-17: 3 mL via RESPIRATORY_TRACT
  Filled 2024-09-17: qty 3

## 2024-09-17 MED ORDER — ALBUTEROL SULFATE (2.5 MG/3ML) 0.083% IN NEBU
10.0000 mg/h | INHALATION_SOLUTION | Freq: Once | RESPIRATORY_TRACT | Status: AC
  Administered 2024-09-17: 10 mg/h via RESPIRATORY_TRACT
  Filled 2024-09-17: qty 12

## 2024-09-17 MED ORDER — ATORVASTATIN CALCIUM 40 MG PO TABS
40.0000 mg | ORAL_TABLET | Freq: Every day | ORAL | Status: DC
  Administered 2024-09-18 – 2024-09-23 (×6): 40 mg via ORAL
  Filled 2024-09-17 (×5): qty 1

## 2024-09-17 MED ORDER — BUSPIRONE HCL 5 MG PO TABS
15.0000 mg | ORAL_TABLET | Freq: Four times a day (QID) | ORAL | Status: DC
  Administered 2024-09-18 – 2024-09-23 (×23): 15 mg via ORAL
  Filled 2024-09-17 (×11): qty 1
  Filled 2024-09-17: qty 2
  Filled 2024-09-17 (×5): qty 1
  Filled 2024-09-17: qty 2
  Filled 2024-09-17 (×3): qty 1

## 2024-09-17 MED ORDER — OXYCODONE HCL 5 MG PO TABS
20.0000 mg | ORAL_TABLET | Freq: Every day | ORAL | Status: DC
  Administered 2024-09-18 – 2024-09-23 (×6): 20 mg via ORAL
  Filled 2024-09-17 (×5): qty 4

## 2024-09-17 MED ORDER — ONDANSETRON HCL 4 MG PO TABS: 4.0000 mg | ORAL_TABLET | Freq: Four times a day (QID) | ORAL | Status: DC | PRN

## 2024-09-17 MED ORDER — DEXAMETHASONE SOD PHOSPHATE PF 10 MG/ML IJ SOLN
10.0000 mg | Freq: Once | INTRAMUSCULAR | Status: AC
  Administered 2024-09-17: 10 mg via INTRAVENOUS

## 2024-09-17 MED ORDER — LACTATED RINGERS IV SOLN: INTRAVENOUS | Status: DC

## 2024-09-17 MED ORDER — ACETAMINOPHEN 650 MG RE SUPP: 650.0000 mg | Freq: Four times a day (QID) | RECTAL | Status: DC | PRN

## 2024-09-17 MED ORDER — UMECLIDINIUM-VILANTEROL 62.5-25 MCG/ACT IN AEPB
1.0000 | INHALATION_SPRAY | Freq: Every day | RESPIRATORY_TRACT | Status: DC
  Filled 2024-09-17: qty 14
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
## 2024-09-17 NOTE — ED Notes (Signed)
 Critical I-stat lactic 3.42 Belfi MD notified
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
## 2024-09-17 NOTE — ED Notes (Signed)
 Significant other at bedside.  Asked ems to take patient to [facility]

## 2024-09-17 NOTE — ED Notes (Signed)
 [place], pa contacted EMS for transport to ED Patient's family at bedside

## 2024-09-17 NOTE — ED Triage Notes (Signed)
 Out of prozac  for 2 days

## 2024-09-17 NOTE — ED Notes (Addendum)
 Attempted 2nd IV and to also pull back blood from initial IV. Both unsuccessful. IV team consulted. Messaged peers for aid.
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
## 2024-09-17 NOTE — ED Notes (Signed)
[facility] at bedside  

## 2024-09-17 NOTE — ED Notes (Signed)
 Patient is being discharged from the Urgent Care and sent to the Emergency Department via [facility] . Per [place], [place], patient is in need of higher level of care due to hypoxemia. Patient is aware and verbalizes understanding of plan of care.  Vitals:   09/17/24 1438 09/17/24 1444  BP: 104/70   Pulse: (!) 107   Resp: (!) 24   Temp: 98.1 F (36.7 C)   SpO2: 95% 94%

## 2024-09-17 NOTE — ED Triage Notes (Signed)
 Complains of sob.  Symptoms started with symptoms 2 days ago.  Frequent coughing.  Coughing worse at night.  Patient has taken nighttime equate theraflu.  Denies runny nose.  2 days ago had chunky, yellow, brown phlegm

## 2024-09-17 NOTE — ED Triage Notes (Signed)
 Pt BIB EMS c/o cough, shob started this morning, coughing up yellowish film, chest and rib soreness from coughing. 83% on RA at urgent care, currently 96% on 4L via [place]  BP 122/76 HR 100 SPO2  CBG 138

## 2024-09-17 NOTE — Sepsis Progress Note (Signed)
 Elink will follow per sepsis protocol.

## 2024-09-18 ENCOUNTER — Inpatient Hospital Stay (HOSPITAL_COMMUNITY)

## 2024-09-18 DIAGNOSIS — F32A Depression, unspecified: Secondary | ICD-10-CM

## 2024-09-18 DIAGNOSIS — J9601 Acute respiratory failure with hypoxia: Secondary | ICD-10-CM

## 2024-09-18 DIAGNOSIS — J441 Chronic obstructive pulmonary disease with (acute) exacerbation: Principal | ICD-10-CM

## 2024-09-18 DIAGNOSIS — F419 Anxiety disorder, unspecified: Secondary | ICD-10-CM

## 2024-09-18 DIAGNOSIS — J189 Pneumonia, unspecified organism: Secondary | ICD-10-CM

## 2024-09-18 DIAGNOSIS — N179 Acute kidney failure, unspecified: Secondary | ICD-10-CM

## 2024-09-18 DIAGNOSIS — G8929 Other chronic pain: Secondary | ICD-10-CM

## 2024-09-18 LAB — BASIC METABOLIC PANEL WITH GFR
Anion gap: 12 (ref 5–15)
BUN: 16 mg/dL (ref 6–20)
CO2: 25 mmol/L (ref 22–32)
Calcium: 9.3 mg/dL (ref 8.9–10.3)
Chloride: 100 mmol/L (ref 98–111)
Creatinine, Ser: 1.1 mg/dL — ABNORMAL HIGH (ref 0.44–1.00)
GFR, Estimated: >60 mL/min
Glucose, Bld: 151 mg/dL — ABNORMAL HIGH (ref 70–99)
Potassium: 4.3 mmol/L (ref 3.5–5.1)
Sodium: 137 mmol/L (ref 135–145)

## 2024-09-18 LAB — LACTIC ACID, PLASMA
Lactic Acid, Venous: 1.4 mmol/L (ref 0.5–1.9)
Lactic Acid, Venous: 1.4 mmol/L (ref 0.5–1.9)

## 2024-09-18 LAB — CBC
HCT: 26.9 % — ABNORMAL LOW (ref 36.0–46.0)
Hemoglobin: 8.7 g/dL — ABNORMAL LOW (ref 12.0–15.0)
MCH: 30.4 pg (ref 26.0–34.0)
MCHC: 32.3 g/dL (ref 30.0–36.0)
MCV: 94.1 fL (ref 80.0–100.0)
Platelets: 248 K/uL (ref 150–400)
RBC: 2.86 MIL/uL — ABNORMAL LOW (ref 3.87–5.11)
RDW: 15.3 % (ref 11.5–15.5)
WBC: 19.4 K/uL — ABNORMAL HIGH (ref 4.0–10.5)
nRBC: 0 % (ref 0.0–0.2)

## 2024-09-18 LAB — I-STAT CG4 LACTIC ACID, ED: Lactic Acid, Venous: 2.9 mmol/L (ref 0.5–1.9)

## 2024-09-18 LAB — MAGNESIUM: Magnesium: 2.1 mg/dL (ref 1.7–2.4)

## 2024-09-18 LAB — HIV ANTIBODY (ROUTINE TESTING W REFLEX): HIV Screen 4th Generation wRfx: NONREACTIVE

## 2024-09-18 MED ORDER — METOPROLOL TARTRATE 5 MG/5ML IV SOLN: 5.0000 mg | INTRAVENOUS | Status: DC | PRN

## 2024-09-18 MED ORDER — GLUCAGON HCL RDNA (DIAGNOSTIC) 1 MG IJ SOLR: 1.0000 mg | INTRAMUSCULAR | Status: DC | PRN

## 2024-09-18 MED ORDER — BUDESONIDE 0.5 MG/2ML IN SUSP
0.5000 mg | Freq: Two times a day (BID) | RESPIRATORY_TRACT | Status: DC
  Administered 2024-09-18 – 2024-09-23 (×11): 0.5 mg via RESPIRATORY_TRACT
  Filled 2024-09-18 (×10): qty 2

## 2024-09-18 MED ORDER — HYDRALAZINE HCL 20 MG/ML IJ SOLN: 10.0000 mg | INTRAMUSCULAR | Status: DC | PRN

## 2024-09-18 MED ORDER — LEVOFLOXACIN IN D5W 500 MG/100ML IV SOLN
500.0000 mg | INTRAVENOUS | Status: DC
  Administered 2024-09-18 – 2024-09-19 (×2): 500 mg via INTRAVENOUS
  Filled 2024-09-18 (×2): qty 100

## 2024-09-18 MED ORDER — FUROSEMIDE 10 MG/ML IJ SOLN
40.0000 mg | Freq: Once | INTRAMUSCULAR | Status: AC
  Administered 2024-09-18: 40 mg via INTRAVENOUS
  Filled 2024-09-18: qty 4

## 2024-09-18 MED ORDER — IPRATROPIUM-ALBUTEROL 0.5-2.5 (3) MG/3ML IN SOLN
3.0000 mL | RESPIRATORY_TRACT | Status: DC | PRN
  Administered 2024-09-18: 3 mL via RESPIRATORY_TRACT

## 2024-09-18 MED ORDER — IPRATROPIUM-ALBUTEROL 0.5-2.5 (3) MG/3ML IN SOLN
3.0000 mL | Freq: Three times a day (TID) | RESPIRATORY_TRACT | Status: DC
  Administered 2024-09-18 – 2024-09-23 (×15): 3 mL via RESPIRATORY_TRACT
  Filled 2024-09-18 (×13): qty 3

## 2024-09-18 MED ORDER — IOHEXOL 350 MG/ML SOLN
75.0000 mL | Freq: Once | INTRAVENOUS | Status: AC | PRN
  Administered 2024-09-18: 75 mL via INTRAVENOUS

## 2024-09-18 NOTE — Progress Notes (Signed)
" ° °  Brief Progress Note   _____________________________________________________________________________________________________________  Patient Name: Kelli Juarez Patient DOB: 29-Aug-1971 Date: @TODAY @      Data: Reviewed labs, notes, VS, Lactic 2.9.    Action: No action needed at this time.      Response:    _____________________________________________________________________________________________________________  [facility] RN Expeditor Sharolyn JONETTA Batman Please contact us  directly via secure chat (search for [facility]) or by calling us  at [phone] [facility]).  "

## 2024-09-18 NOTE — ED Notes (Signed)
 NT went in to obtain pt's vitals but she was using the bathroom & husband insisted that he speak with the provider now in regards to his wife not having a room yet; RN notified.

## 2024-09-18 NOTE — Progress Notes (Signed)
 Patient is requesting Phentermine . However, we do not stock this in our pharmacy per note on MAR. Spoke with spouse, Carlin, who stated he would bring medication when he came to visit with patient.

## 2024-09-18 NOTE — Progress Notes (Signed)
 " PROGRESS NOTE    Kelli Juarez  FMW:[id number] DOB: 11/10/1970 DOA: 09/17/2024 PCP: Celestia Harder, NP    Brief Narrative:   53 year old with history of COPD/asthma, chronic back pain, chronic venous stasis, DM 2, HTN, PTSD, ADHD, HLD, depression presenting to the ED from urgent care for hypoxia.  Patient reported of worsening cough and shortness of breath over the past 3 days prior to admission.  Upon admission chest x-ray showed findings consistent with bronchitis.  Had slightly elevated proBNP.  COVID/flu/RSV were negative.  Started on antibiotics, steroids and bronchodilators.  Assessment & Plan:    Acute hypoxia secondary to community-acquired pneumonia and COPD exacerbation Severe sepsis secondary to community-acquired pneumonia/bronchitis -In the ER patient required 5 L of nasal cannula, not on any oxygen at home.  Started on steroids, bronchodilators, antibiotics.  CTA chest is negative for PE.  1 dose of IV Lasix  -Sepsis physiology slowly improving.  Lactic acidosis is resolved - I-S/flutter valve  Acute kidney injury - Admission creatinine 1.56, baseline around 1.0.  Received gentle hydration but due to some signs of volume overload, will give 1 dose of IV Lasix     Central hypertension - Will resume home medications once appropriate   HypoKalemia Replete as needed   Chronic back pain - Continue gabapentin , oxycodone  and as needed Flexeril    Anxiety and depression PTSD Continue BuSpar , Prozac    ADHD - Hold Adderall   Chronic venous insufficiency SCDs  DVT prophylaxis: Lovenox     Code Status: Full Code Family Communication:   Status is: Inpatient Remains inpatient appropriate because: Ongoing management for acute hypoxia   PT Follow up Recs:   Subjective: Seen at bedside quite tachypneic and short of breath but tells me she feels better compared to yesterday RN present at bedside   Examination:  General exam: Appears calm and comfortable   Respiratory system: Some bilateral rhonchi Cardiovascular system: Sinus tachycardia, no lower extremity edema noted Gastrointestinal system: Abdomen is nondistended, soft and nontender. No organomegaly or masses felt. Normal bowel sounds heard. Central nervous system: Alert and oriented. No focal neurological deficits. Extremities: Symmetric 5 x 5 power. Skin: No rashes, lesions or ulcers Psychiatry: Judgement and insight appear normal. Mood & affect appropriate.                Diet Orders (From admission, onward)     Start     Ordered   09/17/24 2150  Diet regular Room service appropriate? Yes; Fluid consistency: Thin  Diet effective now       Question Answer Comment  Room service appropriate? Yes   Fluid consistency: Thin      09/17/24 2149            Objective: Vitals:   09/18/24 0735 09/18/24 0736 09/18/24 0845 09/18/24 1122  BP: 128/87  (!) 130/93 (!) 149/94  Pulse: (!) 107  (!) 102 (!) 104  Resp: (!) 28  (!) 26 19  Temp:  98.7 F (37.1 C)  98.9 F (37.2 C)  TempSrc:  Oral  Oral  SpO2: 95%  90% 95%  Weight:    90.9 kg  Height:    5' 1 (1.549 m)   No intake or output data in the 24 hours ending 09/18/24 1131 Filed Weights   09/18/24 1122  Weight: 90.9 kg    Scheduled Meds:  atorvastatin   40 mg Oral Daily   budesonide  (PULMICORT ) nebulizer solution  0.5 mg Nebulization BID   busPIRone   15 mg Oral QID  enoxaparin  (LOVENOX ) injection  40 mg Subcutaneous Q24H   FLUoxetine   40 mg Oral BID   furosemide   40 mg Intravenous Once   gabapentin   800 mg Oral TID   ipratropium-albuterol   3 mL Nebulization TID   methylPREDNISolone  (SOLU-MEDROL ) injection  80 mg Intravenous Daily   oxyCODONE   20 mg Oral Q1400   oxyCODONE   40 mg Oral BID   phentermine   37.5 mg Oral q AM   Continuous Infusions:  levofloxacin  (LEVAQUIN ) IV      Nutritional status     Body mass index is 37.86 kg/m.  Data Reviewed:   CBC: Recent Labs  Lab 09/17/24 1650  09/18/24 0557  WBC 20.1* 19.4*  HGB 9.7* 8.7*  HCT 29.4* 26.9*  MCV 93.6 94.1  PLT 263 248   Basic Metabolic Panel: Recent Labs  Lab 09/17/24 1650 09/18/24 0557  NA 135 137  K 3.4* 4.3  CL 96* 100  CO2 24 25  GLUCOSE 132* 151*  BUN 19 16  CREATININE 1.56* 1.10*  CALCIUM  9.0 9.3  MG  --  2.1   GFR: Estimated Creatinine Clearance: 61.4 mL/min (A) (by C-G formula based on SCr of 1.1 mg/dL (H)). Liver Function Tests: No results for input(s): AST, ALT, ALKPHOS, BILITOT, PROT, ALBUMIN  in the last 168 hours. No results for input(s): LIPASE, AMYLASE in the last 168 hours. No results for input(s): AMMONIA in the last 168 hours. Coagulation Profile: No results for input(s): INR, PROTIME in the last 168 hours. Cardiac Enzymes: No results for input(s): CKTOTAL, CKMB, CKMBINDEX, TROPONINI in the last 168 hours. BNP (last 3 results) Recent Labs    09/17/24 1650  PROBNP 934.0*   HbA1C: No results for input(s): HGBA1C in the last 72 hours. CBG: No results for input(s): GLUCAP in the last 168 hours. Lipid Profile: No results for input(s): CHOL, HDL, LDLCALC, TRIG, CHOLHDL, LDLDIRECT in the last 72 hours. Thyroid Function Tests: No results for input(s): TSH, T4TOTAL, FREET4, T3FREE, THYROIDAB in the last 72 hours. Anemia Panel: No results for input(s): VITAMINB12, FOLATE, FERRITIN, TIBC, IRON, RETICCTPCT in the last 72 hours. Sepsis Labs: Recent Labs  Lab 09/17/24 1656 09/17/24 1854 09/18/24 0606 09/18/24 1004  LATICACIDVEN 3.4* 3.1* 2.9* 1.4    Recent Results (from the past 240 hours)  Resp panel by RT-PCR (RSV, Flu A&B, Covid) Anterior Nasal Swab     Status: None   Collection Time: 09/17/24  4:50 PM   Specimen: Anterior Nasal Swab  Result Value Ref Range Status   SARS Coronavirus 2 by RT PCR NEGATIVE NEGATIVE Final    Comment: (NOTE) SARS-CoV-2 target nucleic acids are NOT DETECTED.  The SARS-CoV-2  RNA is generally detectable in upper respiratory specimens during the acute phase of infection. The lowest concentration of SARS-CoV-2 viral copies this assay can detect is 138 copies/mL. A negative result does not preclude SARS-Cov-2 infection and should not be used as the sole basis for treatment or other patient management decisions. A negative result may occur with  improper specimen collection/handling, submission of specimen other than nasopharyngeal swab, presence of viral mutation(s) within the areas targeted by this assay, and inadequate number of viral copies(<138 copies/mL). A negative result must be combined with clinical observations, patient history, and epidemiological information. The expected result is Negative.  Fact Sheet for Patients:  bloggercourse.com  Fact Sheet for Healthcare Providers:  seriousbroker.it  This test is no t yet approved or cleared by the [place]  FDA and  has been authorized for detection and/or  diagnosis of SARS-CoV-2 by FDA under an Emergency Use Authorization (EUA). This EUA will remain  in effect (meaning this test can be used) for the duration of the COVID-19 declaration under Section 564(b)(1) of the Act, 21 U.S.C.section 360bbb-3(b)(1), unless the authorization is terminated  or revoked sooner.       Influenza A by PCR NEGATIVE NEGATIVE Final   Influenza B by PCR NEGATIVE NEGATIVE Final    Comment: (NOTE) The Xpert Xpress SARS-CoV-2/FLU/RSV plus assay is intended as an aid in the diagnosis of influenza from Nasopharyngeal swab specimens and should not be used as a sole basis for treatment. Nasal washings and aspirates are unacceptable for Xpert Xpress SARS-CoV-2/FLU/RSV testing.  Fact Sheet for Patients: bloggercourse.com  Fact Sheet for Healthcare Providers: seriousbroker.it  This test is not yet approved or cleared by the  [place]  FDA and has been authorized for detection and/or diagnosis of SARS-CoV-2 by FDA under an Emergency Use Authorization (EUA). This EUA will remain in effect (meaning this test can be used) for the duration of the COVID-19 declaration under Section 564(b)(1) of the Act, 21 U.S.C. section 360bbb-3(b)(1), unless the authorization is terminated or revoked.     Resp Syncytial Virus by PCR NEGATIVE NEGATIVE Final    Comment: (NOTE) Fact Sheet for Patients: bloggercourse.com  Fact Sheet for Healthcare Providers: seriousbroker.it  This test is not yet approved or cleared by the [place]  FDA and has been authorized for detection and/or diagnosis of SARS-CoV-2 by FDA under an Emergency Use Authorization (EUA). This EUA will remain in effect (meaning this test can be used) for the duration of the COVID-19 declaration under Section 564(b)(1) of the Act, 21 U.S.C. section 360bbb-3(b)(1), unless the authorization is terminated or revoked.  Performed at [facility], [address], [place], [place] [address]   Culture, blood (routine x 2)     Status: None (Preliminary result)   Collection Time: 09/17/24  7:00 PM   Specimen: BLOOD  Result Value Ref Range Status   Specimen Description   Final    BLOOD SITE NOT SPECIFIED Performed at [facility], [address] [place], [place] [address]    Special Requests   Final    BOTTLES DRAWN AEROBIC AND ANAEROBIC Blood Culture results may not be optimal due to an inadequate volume of blood received in culture bottles Performed at [facility], [address] [place], [place] [address]    Culture   Final    NO GROWTH < 12 HOURS Performed at [facility], [address] [place], [place] [address]    Report Status PENDING  Incomplete         Radiology Studies: CT Angio Chest Pulmonary Embolism (PE) W or WO  Contrast Result Date: 09/18/2024 CLINICAL DATA:  Shortness of breath EXAM: CT ANGIOGRAPHY CHEST WITH CONTRAST TECHNIQUE: Multidetector CT imaging of the chest was performed using the standard protocol during bolus administration of intravenous contrast. Multiplanar CT image reconstructions and MIPs were obtained to evaluate the vascular anatomy. RADIATION DOSE REDUCTION: This exam was performed according to the departmental dose-optimization program which includes automated exposure control, adjustment of the mA and/or kV according to patient size and/or use of iterative reconstruction technique. CONTRAST:  75mL OMNIPAQUE  IOHEXOL  350 MG/ML SOLN COMPARISON:  March 18, 2024 FINDINGS: Cardiovascular: Satisfactory opacification of the pulmonary arteries to the segmental level. No evidence of pulmonary embolism. Normal heart size. No pericardial effusion. Mediastinum/Nodes: No enlarged mediastinal, hilar, or axillary lymph  nodes. Thyroid gland, trachea, and esophagus demonstrate no significant findings. Lungs/Pleura: No pneumothorax or pleural effusion is noted. Diffuse bilateral lung opacities are noted in the upper and lower lobes bilaterally with some peripheral sparing, concerning for edema or multifocal pneumonia. Upper Abdomen: No acute abnormality. Musculoskeletal: No chest wall abnormality. No acute or significant osseous findings. Review of the MIP images confirms the above findings. IMPRESSION: 1. No definite evidence of pulmonary embolus. 2. Diffuse bilateral lung opacities are noted with some peripheral sparing, concerning for edema or multifocal pneumonia. Electronically Signed   By: Lynwood Landy Raddle M.D.   On: 09/18/2024 10:33   DG Chest Port 1 View Result Date: 09/17/2024 CLINICAL DATA:  Cough and shortness of breath. EXAM: PORTABLE CHEST 1 VIEW COMPARISON:  03/18/2024 FINDINGS: Lung volumes are low. The heart is borderline enlarged. Moderate diffuse peribronchial thickening. No confluent airspace  disease. No pneumothorax or large pleural effusion. On limited assessment, no acute osseous findings. IMPRESSION: Moderate diffuse peribronchial thickening, can be seen with bronchitis or asthma. Electronically Signed   By: Andrea Gasman M.D.   On: 09/17/2024 18:53           LOS: 1 day   Time spent= 35 mins    Burgess JAYSON Dare, MD [facility]  If 7PM-7AM, please contact night-coverage  09/18/2024, 11:31 AM  "

## 2024-09-18 NOTE — Plan of Care (Signed)
  Problem: Clinical Measurements: Goal: Diagnostic test results will improve Outcome: Progressing   Problem: Clinical Measurements: Goal: Respiratory complications will improve Outcome: Progressing   Problem: Clinical Measurements: Goal: Cardiovascular complication will be avoided Outcome: Progressing   Problem: Nutrition: Goal: Adequate nutrition will be maintained Outcome: Progressing   Problem: Coping: Goal: Level of anxiety will decrease Outcome: Progressing   

## 2024-09-18 NOTE — Hospital Course (Addendum)
 Brief Narrative:   53 year old with history of COPD/asthma, chronic back pain, chronic venous stasis, DM 2, HTN, PTSD, ADHD, HLD, depression presenting to the ED from urgent care for hypoxia.  Patient reported of worsening cough and shortness of breath over the past 3 days prior to admission.  Upon admission chest x-ray showed findings consistent with bronchitis.  Had slightly elevated proBNP.  COVID/flu/RSV were negative.  Started on antibiotics, steroids and bronchodilators.  Had AKI on admission but this is now resolved.  Leukocytosis persist secondary to steroid use.  Overall very slow to improve still needing 5 L of nasal cannula.  Assessment & Plan:    Acute hypoxia secondary to community-acquired pneumonia and COPD exacerbation Severe sepsis secondary to community-acquired pneumonia/bronchitis -In the ER patient required 5 L of nasal cannula, not on any oxygen at home.  Started on steroids, bronchodilators, antibiotics.  CTA chest is negative for PE.    Advised nursing staff to continue to wean oxygen, still remains on 5 L.  Getting intermittent IV Lasix . -Sepsis physiology slowly improving.  Lactic acidosis is resolved.  Leukocytosis secondary to steroid use - I-S/flutter valve  Acute kidney injury, resolved - Admission creatinine 1.56, baseline around 1.0.  - BMP stable    Central hypertension - Will resume home medications once appropriate   HypoKalemia Replete as needed   Chronic back pain - Continue gabapentin , oxycodone  and as needed Flexeril    Anxiety and depression PTSD Continue BuSpar , Prozac    ADHD - Hold Adderall   Chronic venous insufficiency SCDs  DVT prophylaxis: Lovenox     Code Status: Full Code Family Communication:   Status is: Inpatient Remains inpatient appropriate because: Ongoing management for acute hypoxia   PT Follow up Recs:   Subjective:  Feels slightly better compared to yesterday Still having exertional dyspnea when moving to commode  and getting back  Examination:  General exam: Appears calm and comfortable  Respiratory system: Some bilateral rhonchiWith slight improvement compared to yesterday Cardiovascular system: Sinus tachycardia, no lower extremity edema noted Gastrointestinal system: Abdomen is nondistended, soft and nontender. No organomegaly or masses felt. Normal bowel sounds heard. Central nervous system: Alert and oriented. No focal neurological deficits. Extremities: Symmetric 5 x 5 power. Skin: No rashes, lesions or ulcers Psychiatry: Judgement and insight appear normal. Mood & affect appropriate.

## 2024-09-19 DIAGNOSIS — J9601 Acute respiratory failure with hypoxia: Secondary | ICD-10-CM | POA: Diagnosis not present

## 2024-09-19 LAB — CBC
HCT: 26.5 % — ABNORMAL LOW (ref 36.0–46.0)
Hemoglobin: 8.6 g/dL — ABNORMAL LOW (ref 12.0–15.0)
MCH: 30.3 pg (ref 26.0–34.0)
MCHC: 32.5 g/dL (ref 30.0–36.0)
MCV: 93.3 fL (ref 80.0–100.0)
Platelets: 187 K/uL (ref 150–400)
RBC: 2.84 MIL/uL — ABNORMAL LOW (ref 3.87–5.11)
RDW: 15.7 % — ABNORMAL HIGH (ref 11.5–15.5)
WBC: 20.5 K/uL — ABNORMAL HIGH (ref 4.0–10.5)
nRBC: 0 % (ref 0.0–0.2)

## 2024-09-19 LAB — BASIC METABOLIC PANEL WITH GFR
Anion gap: 11 (ref 5–15)
BUN: 21 mg/dL — ABNORMAL HIGH (ref 6–20)
CO2: 26 mmol/L (ref 22–32)
Calcium: 9 mg/dL (ref 8.9–10.3)
Chloride: 101 mmol/L (ref 98–111)
Creatinine, Ser: 0.94 mg/dL (ref 0.44–1.00)
GFR, Estimated: >60 mL/min
Glucose, Bld: 100 mg/dL — ABNORMAL HIGH (ref 70–99)
Potassium: 4 mmol/L (ref 3.5–5.1)
Sodium: 139 mmol/L (ref 135–145)

## 2024-09-19 LAB — PHOSPHORUS: Phosphorus: 2.7 mg/dL (ref 2.5–4.6)

## 2024-09-19 LAB — MAGNESIUM: Magnesium: 2.4 mg/dL (ref 1.7–2.4)

## 2024-09-19 MED ORDER — GUAIFENESIN ER 600 MG PO TB12
1200.0000 mg | ORAL_TABLET | Freq: Two times a day (BID) | ORAL | Status: DC
  Administered 2024-09-19 – 2024-09-23 (×9): 1200 mg via ORAL
  Filled 2024-09-19 (×8): qty 2

## 2024-09-19 NOTE — Progress Notes (Signed)
 " PROGRESS NOTE    Kelli Juarez  FMW:[id number] DOB: 1971-08-19 DOA: 09/17/2024 PCP: Celestia Harder, NP    Brief Narrative:   53 year old with history of COPD/asthma, chronic back pain, chronic venous stasis, DM 2, HTN, PTSD, ADHD, HLD, depression presenting to the ED from urgent care for hypoxia.  Patient reported of worsening cough and shortness of breath over the past 3 days prior to admission.  Upon admission chest x-ray showed findings consistent with bronchitis.  Had slightly elevated proBNP.  COVID/flu/RSV were negative.  Started on antibiotics, steroids and bronchodilators.  Assessment & Plan:    Acute hypoxia secondary to community-acquired pneumonia and COPD exacerbation Severe sepsis secondary to community-acquired pneumonia/bronchitis -In the ER patient required 5 L of nasal cannula, not on any oxygen at home.  Started on steroids, bronchodilators, antibiotics.  CTA chest is negative for PE.  1 dose of IV Lasix  -Sepsis physiology slowly improving.  Lactic acidosis is resolved.  Leukocytosis secondary to steroid use - I-S/flutter valve  Acute kidney injury - Admission creatinine 1.56, baseline around 1.0.  Initially dehydrated but thereafter received 1 dose of Lasix  - BMP stable    Central hypertension - Will resume home medications once appropriate   HypoKalemia Replete as needed   Chronic back pain - Continue gabapentin , oxycodone  and as needed Flexeril    Anxiety and depression PTSD Continue BuSpar , Prozac    ADHD - Hold Adderall   Chronic venous insufficiency SCDs  DVT prophylaxis: Lovenox     Code Status: Full Code Family Communication:   Status is: Inpatient Remains inpatient appropriate because: Ongoing management for acute hypoxia   PT Follow up Recs:   Subjective: Feels little better but having more coughing today   Examination:  General exam: Appears calm and comfortable  Respiratory system: Some bilateral rhonchiWith slight improvement  compared to yesterday Cardiovascular system: Sinus tachycardia, no lower extremity edema noted Gastrointestinal system: Abdomen is nondistended, soft and nontender. No organomegaly or masses felt. Normal bowel sounds heard. Central nervous system: Alert and oriented. No focal neurological deficits. Extremities: Symmetric 5 x 5 power. Skin: No rashes, lesions or ulcers Psychiatry: Judgement and insight appear normal. Mood & affect appropriate.                Diet Orders (From admission, onward)     Start     Ordered   09/17/24 2150  Diet regular Room service appropriate? Yes; Fluid consistency: Thin  Diet effective now       Question Answer Comment  Room service appropriate? Yes   Fluid consistency: Thin      09/17/24 2149            Objective: Vitals:   09/18/24 1623 09/18/24 1942 09/19/24 0013 09/19/24 0937  BP:  125/79 115/73   Pulse:  99 98   Resp:  17 18   Temp:  98.4 F (36.9 C) 98.3 F (36.8 C)   TempSrc:      SpO2: 94% 98% 94% 96%  Weight:      Height:        Intake/Output Summary (Last 24 hours) at 09/19/2024 1032 Last data filed at 09/18/2024 1700 Gross per 24 hour  Intake 400 ml  Output --  Net 400 ml   Filed Weights   09/18/24 1122  Weight: 90.9 kg    Scheduled Meds:  atorvastatin   40 mg Oral Daily   budesonide  (PULMICORT ) nebulizer solution  0.5 mg Nebulization BID   busPIRone   15 mg Oral QID  enoxaparin  (LOVENOX ) injection  40 mg Subcutaneous Q24H   FLUoxetine   40 mg Oral BID   gabapentin   800 mg Oral TID   guaiFENesin   1,200 mg Oral BID   ipratropium-albuterol   3 mL Nebulization TID   methylPREDNISolone  (SOLU-MEDROL ) injection  80 mg Intravenous Daily   oxyCODONE   20 mg Oral Q1400   oxyCODONE   40 mg Oral BID   phentermine   37.5 mg Oral q AM   Continuous Infusions:  levofloxacin  (LEVAQUIN ) IV 500 mg (09/18/24 1840)    Nutritional status     Body mass index is 37.86 kg/m.  Data Reviewed:   CBC: Recent Labs  Lab  09/17/24 1650 09/18/24 0557 09/19/24 0359  WBC 20.1* 19.4* 20.5*  HGB 9.7* 8.7* 8.6*  HCT 29.4* 26.9* 26.5*  MCV 93.6 94.1 93.3  PLT 263 248 187   Basic Metabolic Panel: Recent Labs  Lab 09/17/24 1650 09/18/24 0557 09/19/24 0359 09/19/24 0804  NA 135 137  --  139  K 3.4* 4.3  --  4.0  CL 96* 100  --  101  CO2 24 25  --  26  GLUCOSE 132* 151*  --  100*  BUN 19 16  --  21*  CREATININE 1.56* 1.10*  --  0.94  CALCIUM  9.0 9.3  --  9.0  MG  --  2.1 2.4  --   PHOS  --   --  2.7  --    GFR: Estimated Creatinine Clearance: 71.8 mL/min (by C-G formula based on SCr of 0.94 mg/dL). Liver Function Tests: No results for input(s): AST, ALT, ALKPHOS, BILITOT, PROT, ALBUMIN  in the last 168 hours. No results for input(s): LIPASE, AMYLASE in the last 168 hours. No results for input(s): AMMONIA in the last 168 hours. Coagulation Profile: No results for input(s): INR, PROTIME in the last 168 hours. Cardiac Enzymes: No results for input(s): CKTOTAL, CKMB, CKMBINDEX, TROPONINI in the last 168 hours. BNP (last 3 results) Recent Labs    09/17/24 1650  PROBNP 934.0*   HbA1C: No results for input(s): HGBA1C in the last 72 hours. CBG: No results for input(s): GLUCAP in the last 168 hours. Lipid Profile: No results for input(s): CHOL, HDL, LDLCALC, TRIG, CHOLHDL, LDLDIRECT in the last 72 hours. Thyroid Function Tests: No results for input(s): TSH, T4TOTAL, FREET4, T3FREE, THYROIDAB in the last 72 hours. Anemia Panel: No results for input(s): VITAMINB12, FOLATE, FERRITIN, TIBC, IRON, RETICCTPCT in the last 72 hours. Sepsis Labs: Recent Labs  Lab 09/17/24 1854 09/18/24 0606 09/18/24 1004 09/18/24 1207  LATICACIDVEN 3.1* 2.9* 1.4 1.4    Recent Results (from the past 240 hours)  Resp panel by RT-PCR (RSV, Flu A&B, Covid) Anterior Nasal Swab     Status: None   Collection Time: 09/17/24  4:50 PM   Specimen: Anterior  Nasal Swab  Result Value Ref Range Status   SARS Coronavirus 2 by RT PCR NEGATIVE NEGATIVE Final    Comment: (NOTE) SARS-CoV-2 target nucleic acids are NOT DETECTED.  The SARS-CoV-2 RNA is generally detectable in upper respiratory specimens during the acute phase of infection. The lowest concentration of SARS-CoV-2 viral copies this assay can detect is 138 copies/mL. A negative result does not preclude SARS-Cov-2 infection and should not be used as the sole basis for treatment or other patient management decisions. A negative result may occur with  improper specimen collection/handling, submission of specimen other than nasopharyngeal swab, presence of viral mutation(s) within the areas targeted by this assay, and inadequate number of viral  copies(<138 copies/mL). A negative result must be combined with clinical observations, patient history, and epidemiological information. The expected result is Negative.  Fact Sheet for Patients:  bloggercourse.com  Fact Sheet for Healthcare Providers:  seriousbroker.it  This test is no t yet approved or cleared by the [place]  FDA and  has been authorized for detection and/or diagnosis of SARS-CoV-2 by FDA under an Emergency Use Authorization (EUA). This EUA will remain  in effect (meaning this test can be used) for the duration of the COVID-19 declaration under Section 564(b)(1) of the Act, 21 U.S.C.section 360bbb-3(b)(1), unless the authorization is terminated  or revoked sooner.       Influenza A by PCR NEGATIVE NEGATIVE Final   Influenza B by PCR NEGATIVE NEGATIVE Final    Comment: (NOTE) The Xpert Xpress SARS-CoV-2/FLU/RSV plus assay is intended as an aid in the diagnosis of influenza from Nasopharyngeal swab specimens and should not be used as a sole basis for treatment. Nasal washings and aspirates are unacceptable for Xpert Xpress SARS-CoV-2/FLU/RSV testing.  Fact Sheet for  Patients: bloggercourse.com  Fact Sheet for Healthcare Providers: seriousbroker.it  This test is not yet approved or cleared by the [place]  FDA and has been authorized for detection and/or diagnosis of SARS-CoV-2 by FDA under an Emergency Use Authorization (EUA). This EUA will remain in effect (meaning this test can be used) for the duration of the COVID-19 declaration under Section 564(b)(1) of the Act, 21 U.S.C. section 360bbb-3(b)(1), unless the authorization is terminated or revoked.     Resp Syncytial Virus by PCR NEGATIVE NEGATIVE Final    Comment: (NOTE) Fact Sheet for Patients: bloggercourse.com  Fact Sheet for Healthcare Providers: seriousbroker.it  This test is not yet approved or cleared by the [place]  FDA and has been authorized for detection and/or diagnosis of SARS-CoV-2 by FDA under an Emergency Use Authorization (EUA). This EUA will remain in effect (meaning this test can be used) for the duration of the COVID-19 declaration under Section 564(b)(1) of the Act, 21 U.S.C. section 360bbb-3(b)(1), unless the authorization is terminated or revoked.  Performed at [facility], [address] [place], [place] [address]   Culture, blood (routine x 2)     Status: None (Preliminary result)   Collection Time: 09/17/24  7:00 PM   Specimen: BLOOD  Result Value Ref Range Status   Specimen Description   Final    BLOOD SITE NOT SPECIFIED Performed at [facility], [address] [place], [place] [address]    Special Requests   Final    BOTTLES DRAWN AEROBIC AND ANAEROBIC Blood Culture results may not be optimal due to an inadequate volume of blood received in culture bottles Performed at [facility], [address] [place], [place] [address]    Culture   Final    NO GROWTH < 12 HOURS Performed at  [facility], [address], [place], [place] [address]    Report Status PENDING  Incomplete         Radiology Studies: CT Angio Chest Pulmonary Embolism (PE) W or WO Contrast Result Date: 09/18/2024 CLINICAL DATA:  Shortness of breath EXAM: CT ANGIOGRAPHY CHEST WITH CONTRAST TECHNIQUE: Multidetector CT imaging of the chest was performed using the standard protocol during bolus administration of intravenous contrast. Multiplanar CT image reconstructions and MIPs were obtained to evaluate the vascular anatomy. RADIATION DOSE REDUCTION: This exam was performed according to the departmental dose-optimization program which includes automated exposure control, adjustment  of the mA and/or kV according to patient size and/or use of iterative reconstruction technique. CONTRAST:  75mL OMNIPAQUE  IOHEXOL  350 MG/ML SOLN COMPARISON:  March 18, 2024 FINDINGS: Cardiovascular: Satisfactory opacification of the pulmonary arteries to the segmental level. No evidence of pulmonary embolism. Normal heart size. No pericardial effusion. Mediastinum/Nodes: No enlarged mediastinal, hilar, or axillary lymph nodes. Thyroid gland, trachea, and esophagus demonstrate no significant findings. Lungs/Pleura: No pneumothorax or pleural effusion is noted. Diffuse bilateral lung opacities are noted in the upper and lower lobes bilaterally with some peripheral sparing, concerning for edema or multifocal pneumonia. Upper Abdomen: No acute abnormality. Musculoskeletal: No chest wall abnormality. No acute or significant osseous findings. Review of the MIP images confirms the above findings. IMPRESSION: 1. No definite evidence of pulmonary embolus. 2. Diffuse bilateral lung opacities are noted with some peripheral sparing, concerning for edema or multifocal pneumonia. Electronically Signed   By: Lynwood Landy Raddle M.D.   On: 09/18/2024 10:33   DG Chest Port 1 View Result Date: 09/17/2024 CLINICAL DATA:  Cough and shortness of breath.  EXAM: PORTABLE CHEST 1 VIEW COMPARISON:  03/18/2024 FINDINGS: Lung volumes are low. The heart is borderline enlarged. Moderate diffuse peribronchial thickening. No confluent airspace disease. No pneumothorax or large pleural effusion. On limited assessment, no acute osseous findings. IMPRESSION: Moderate diffuse peribronchial thickening, can be seen with bronchitis or asthma. Electronically Signed   By: Andrea Gasman M.D.   On: 09/17/2024 18:53           LOS: 2 days   Time spent= 35 mins    Burgess JAYSON Dare, MD [facility]  If 7PM-7AM, please contact night-coverage  09/19/2024, 10:32 AM  "

## 2024-09-20 DIAGNOSIS — J9601 Acute respiratory failure with hypoxia: Secondary | ICD-10-CM | POA: Diagnosis not present

## 2024-09-20 MED ORDER — LEVOFLOXACIN 500 MG PO TABS
500.0000 mg | ORAL_TABLET | Freq: Every day | ORAL | Status: AC
  Administered 2024-09-20 – 2024-09-21 (×2): 500 mg via ORAL
  Filled 2024-09-20 (×2): qty 1

## 2024-09-20 NOTE — Plan of Care (Signed)
" °  Problem: Respiratory: Goal: Ability to maintain adequate ventilation will improve Outcome: Progressing   Problem: Safety: Goal: Ability to remain free from injury will improve Outcome: Progressing   Problem: Pain Managment: Goal: General experience of comfort will improve and/or be controlled Outcome: Progressing   Problem: Elimination: Goal: Will not experience complications related to urinary retention Outcome: Progressing   Problem: Nutrition: Goal: Adequate nutrition will be maintained Outcome: Progressing   "

## 2024-09-20 NOTE — Progress Notes (Signed)
 " PROGRESS NOTE    Kelli Juarez  FMW:[id number] DOB: January 09, 1971 DOA: 09/17/2024 PCP: Celestia Harder, NP    Brief Narrative:   53 year old with history of COPD/asthma, chronic back pain, chronic venous stasis, DM 2, HTN, PTSD, ADHD, HLD, depression presenting to the ED from urgent care for hypoxia.  Patient reported of worsening cough and shortness of breath over the past 3 days prior to admission.  Upon admission chest x-ray showed findings consistent with bronchitis.  Had slightly elevated proBNP.  COVID/flu/RSV were negative.  Started on antibiotics, steroids and bronchodilators.  Assessment & Plan:    Acute hypoxia secondary to community-acquired pneumonia and COPD exacerbation Severe sepsis secondary to community-acquired pneumonia/bronchitis -In the ER patient required 5 L of nasal cannula, not on any oxygen at home.  Started on steroids, bronchodilators, antibiotics.  CTA chest is negative for PE.  1 dose of IV Lasix  -Sepsis physiology slowly improving.  Lactic acidosis is resolved.  Leukocytosis secondary to steroid use - I-S/flutter valve  Acute kidney injury - Admission creatinine 1.56, baseline around 1.0.  Initially dehydrated but thereafter received 1 dose of Lasix  - BMP stable    Central hypertension - Will resume home medications once appropriate   HypoKalemia Replete as needed   Chronic back pain - Continue gabapentin , oxycodone  and as needed Flexeril    Anxiety and depression PTSD Continue BuSpar , Prozac    ADHD - Hold Adderall   Chronic venous insufficiency SCDs  DVT prophylaxis: Lovenox     Code Status: Full Code Family Communication:   Status is: Inpatient Remains inpatient appropriate because: Ongoing management for acute hypoxia   PT Follow up Recs:   Subjective: Still having SOB, but improved a little from yesterday   Examination:  General exam: Appears calm and comfortable  Respiratory system: Some bilateral rhonchiWith slight  improvement compared to yesterday Cardiovascular system: Sinus tachycardia, no lower extremity edema noted Gastrointestinal system: Abdomen is nondistended, soft and nontender. No organomegaly or masses felt. Normal bowel sounds heard. Central nervous system: Alert and oriented. No focal neurological deficits. Extremities: Symmetric 5 x 5 power. Skin: No rashes, lesions or ulcers Psychiatry: Judgement and insight appear normal. Mood & affect appropriate.                Diet Orders (From admission, onward)     Start     Ordered   09/17/24 2150  Diet regular Room service appropriate? Yes; Fluid consistency: Thin  Diet effective now       Question Answer Comment  Room service appropriate? Yes   Fluid consistency: Thin      09/17/24 2149            Objective: Vitals:   09/19/24 2010 09/19/24 2106 09/20/24 0503 09/20/24 0955  BP:  125/86 (!) 130/93   Pulse:  (!) 102 99   Resp:  17    Temp:  98.2 F (36.8 C) 98.7 F (37.1 C)   TempSrc:  Oral Oral   SpO2: 97% 95% 91% 94%  Weight:      Height:        Intake/Output Summary (Last 24 hours) at 09/20/2024 1129 Last data filed at 09/19/2024 1900 Gross per 24 hour  Intake 460 ml  Output --  Net 460 ml   Filed Weights   09/18/24 1122  Weight: 90.9 kg    Scheduled Meds:  atorvastatin   40 mg Oral Daily   budesonide  (PULMICORT ) nebulizer solution  0.5 mg Nebulization BID   busPIRone   15 mg Oral  QID   enoxaparin  (LOVENOX ) injection  40 mg Subcutaneous Q24H   FLUoxetine   40 mg Oral BID   gabapentin   800 mg Oral TID   guaiFENesin   1,200 mg Oral BID   ipratropium-albuterol   3 mL Nebulization TID   levofloxacin   500 mg Oral q1800   methylPREDNISolone  (SOLU-MEDROL ) injection  80 mg Intravenous Daily   oxyCODONE   20 mg Oral Q1400   oxyCODONE   40 mg Oral BID   phentermine   37.5 mg Oral q AM   Continuous Infusions:  Nutritional status     Body mass index is 37.86 kg/m.  Data Reviewed:   CBC: Recent Labs   Lab 09/17/24 1650 09/18/24 0557 09/19/24 0359  WBC 20.1* 19.4* 20.5*  HGB 9.7* 8.7* 8.6*  HCT 29.4* 26.9* 26.5*  MCV 93.6 94.1 93.3  PLT 263 248 187   Basic Metabolic Panel: Recent Labs  Lab 09/17/24 1650 09/18/24 0557 09/19/24 0359 09/19/24 0804  NA 135 137  --  139  K 3.4* 4.3  --  4.0  CL 96* 100  --  101  CO2 24 25  --  26  GLUCOSE 132* 151*  --  100*  BUN 19 16  --  21*  CREATININE 1.56* 1.10*  --  0.94  CALCIUM  9.0 9.3  --  9.0  MG  --  2.1 2.4  --   PHOS  --   --  2.7  --    GFR: Estimated Creatinine Clearance: 71.8 mL/min (by C-G formula based on SCr of 0.94 mg/dL). Liver Function Tests: No results for input(s): AST, ALT, ALKPHOS, BILITOT, PROT, ALBUMIN  in the last 168 hours. No results for input(s): LIPASE, AMYLASE in the last 168 hours. No results for input(s): AMMONIA in the last 168 hours. Coagulation Profile: No results for input(s): INR, PROTIME in the last 168 hours. Cardiac Enzymes: No results for input(s): CKTOTAL, CKMB, CKMBINDEX, TROPONINI in the last 168 hours. BNP (last 3 results) Recent Labs    09/17/24 1650  PROBNP 934.0*   HbA1C: No results for input(s): HGBA1C in the last 72 hours. CBG: No results for input(s): GLUCAP in the last 168 hours. Lipid Profile: No results for input(s): CHOL, HDL, LDLCALC, TRIG, CHOLHDL, LDLDIRECT in the last 72 hours. Thyroid Function Tests: No results for input(s): TSH, T4TOTAL, FREET4, T3FREE, THYROIDAB in the last 72 hours. Anemia Panel: No results for input(s): VITAMINB12, FOLATE, FERRITIN, TIBC, IRON, RETICCTPCT in the last 72 hours. Sepsis Labs: Recent Labs  Lab 09/17/24 1854 09/18/24 0606 09/18/24 1004 09/18/24 1207  LATICACIDVEN 3.1* 2.9* 1.4 1.4    Recent Results (from the past 240 hours)  Resp panel by RT-PCR (RSV, Flu A&B, Covid) Anterior Nasal Swab     Status: None   Collection Time: 09/17/24  4:50 PM   Specimen:  Anterior Nasal Swab  Result Value Ref Range Status   SARS Coronavirus 2 by RT PCR NEGATIVE NEGATIVE Final    Comment: (NOTE) SARS-CoV-2 target nucleic acids are NOT DETECTED.  The SARS-CoV-2 RNA is generally detectable in upper respiratory specimens during the acute phase of infection. The lowest concentration of SARS-CoV-2 viral copies this assay can detect is 138 copies/mL. A negative result does not preclude SARS-Cov-2 infection and should not be used as the sole basis for treatment or other patient management decisions. A negative result may occur with  improper specimen collection/handling, submission of specimen other than nasopharyngeal swab, presence of viral mutation(s) within the areas targeted by this assay, and inadequate number of  viral copies(<138 copies/mL). A negative result must be combined with clinical observations, patient history, and epidemiological information. The expected result is Negative.  Fact Sheet for Patients:  bloggercourse.com  Fact Sheet for Healthcare Providers:  seriousbroker.it  This test is no t yet approved or cleared by the [place]  FDA and  has been authorized for detection and/or diagnosis of SARS-CoV-2 by FDA under an Emergency Use Authorization (EUA). This EUA will remain  in effect (meaning this test can be used) for the duration of the COVID-19 declaration under Section 564(b)(1) of the Act, 21 U.S.C.section 360bbb-3(b)(1), unless the authorization is terminated  or revoked sooner.       Influenza A by PCR NEGATIVE NEGATIVE Final   Influenza B by PCR NEGATIVE NEGATIVE Final    Comment: (NOTE) The Xpert Xpress SARS-CoV-2/FLU/RSV plus assay is intended as an aid in the diagnosis of influenza from Nasopharyngeal swab specimens and should not be used as a sole basis for treatment. Nasal washings and aspirates are unacceptable for Xpert Xpress SARS-CoV-2/FLU/RSV testing.  Fact  Sheet for Patients: bloggercourse.com  Fact Sheet for Healthcare Providers: seriousbroker.it  This test is not yet approved or cleared by the [place]  FDA and has been authorized for detection and/or diagnosis of SARS-CoV-2 by FDA under an Emergency Use Authorization (EUA). This EUA will remain in effect (meaning this test can be used) for the duration of the COVID-19 declaration under Section 564(b)(1) of the Act, 21 U.S.C. section 360bbb-3(b)(1), unless the authorization is terminated or revoked.     Resp Syncytial Virus by PCR NEGATIVE NEGATIVE Final    Comment: (NOTE) Fact Sheet for Patients: bloggercourse.com  Fact Sheet for Healthcare Providers: seriousbroker.it  This test is not yet approved or cleared by the [place]  FDA and has been authorized for detection and/or diagnosis of SARS-CoV-2 by FDA under an Emergency Use Authorization (EUA). This EUA will remain in effect (meaning this test can be used) for the duration of the COVID-19 declaration under Section 564(b)(1) of the Act, 21 U.S.C. section 360bbb-3(b)(1), unless the authorization is terminated or revoked.  Performed at [facility], [address], [place], [place] [address]   Culture, blood (routine x 2)     Status: None (Preliminary result)   Collection Time: 09/17/24  7:00 PM   Specimen: BLOOD  Result Value Ref Range Status   Specimen Description   Final    BLOOD SITE NOT SPECIFIED Performed at [facility], [address], [place], [place] [address]    Special Requests   Final    BOTTLES DRAWN AEROBIC AND ANAEROBIC Blood Culture results may not be optimal due to an inadequate volume of blood received in culture bottles Performed at [facility], [address] [place], [place] [address]    Culture   Final    NO GROWTH 3 DAYS Performed  at [facility], [address], [place], [place] [address]    Report Status PENDING  Incomplete  Culture, blood (routine x 2)     Status: None (Preliminary result)   Collection Time: 09/18/24 12:07 PM   Specimen: BLOOD LEFT HAND  Result Value Ref Range Status   Specimen Description   Final    BLOOD LEFT HAND Performed at [facility], [address], [place], [place] [address]    Special Requests   Final    BOTTLES DRAWN AEROBIC AND ANAEROBIC Blood Culture results may not be optimal due to an inadequate  volume of blood received in culture bottles Performed at [facility], [address], [place], [place] [address]    Culture   Final    NO GROWTH 2 DAYS Performed at [facility], [address], [place], [place] [address]    Report Status PENDING  Incomplete         Radiology Studies: No results found.         LOS: 3 days   Time spent= 35 mins    Burgess JAYSON Dare, MD [facility]  If 7PM-7AM, please contact night-coverage  09/20/2024, 11:29 AM  "

## 2024-09-21 DIAGNOSIS — J9601 Acute respiratory failure with hypoxia: Secondary | ICD-10-CM | POA: Diagnosis not present

## 2024-09-21 MED ORDER — FUROSEMIDE 40 MG PO TABS
40.0000 mg | ORAL_TABLET | Freq: Once | ORAL | Status: AC
  Administered 2024-09-21: 40 mg via ORAL
  Filled 2024-09-21: qty 1

## 2024-09-21 MED ORDER — OXYCODONE HCL 5 MG PO TABS
40.0000 mg | ORAL_TABLET | Freq: Two times a day (BID) | ORAL | Status: DC
  Administered 2024-09-21 – 2024-09-23 (×4): 40 mg via ORAL
  Filled 2024-09-21 (×3): qty 8

## 2024-09-21 NOTE — Progress Notes (Signed)
 Patients morning BP is 157/104. Patient has orders for Apresoline  10 mg IV for SBP >170. There was no parameters for the diastolic BP so NP Andrez was contacted and stated DBP >110 will be the parameter for apresoline . Based on the parameters no apresoline  will be given.

## 2024-09-21 NOTE — Progress Notes (Signed)
 SATURATION QUALIFICATIONS: (This note is used to comply with regulatory documentation for home oxygen)  Patient Saturations on Room Air at Rest = 92%  Patient Saturations on Room Air while Ambulating = 82%  Patient Saturations on  1 Liters of oxygen while Ambulating = 95%  Please briefly explain why patient needs home oxygen:

## 2024-09-21 NOTE — Progress Notes (Signed)
 " PROGRESS NOTE    Kelli Juarez  FMW:[id number] DOB: 06/25/1971 DOA: 09/17/2024 PCP: Celestia Harder, NP    Brief Narrative:   53 year old with history of COPD/asthma, chronic back pain, chronic venous stasis, DM 2, HTN, PTSD, ADHD, HLD, depression presenting to the ED from urgent care for hypoxia.  Patient reported of worsening cough and shortness of breath over the past 3 days prior to admission.  Upon admission chest x-ray showed findings consistent with bronchitis.  Had slightly elevated proBNP.  COVID/flu/RSV were negative.  Started on antibiotics, steroids and bronchodilators.  Had AKI on admission but this is now resolved.  Leukocytosis persist secondary to steroid use.  Overall very slow to improve still needing 5 L of nasal cannula.  Assessment & Plan:    Acute hypoxia secondary to community-acquired pneumonia and COPD exacerbation Severe sepsis secondary to community-acquired pneumonia/bronchitis -In the ER patient required 5 L of nasal cannula, not on any oxygen at home.  Started on steroids, bronchodilators, antibiotics.  CTA chest is negative for PE.    Advised nursing staff to continue to wean oxygen, still remains on 5 L.  Getting intermittent IV Lasix . -Sepsis physiology slowly improving.  Lactic acidosis is resolved.  Leukocytosis secondary to steroid use - I-S/flutter valve  Acute kidney injury, resolved - Admission creatinine 1.56, baseline around 1.0.  - BMP stable    Central hypertension - Will resume home medications once appropriate   HypoKalemia Replete as needed   Chronic back pain - Continue gabapentin , oxycodone  and as needed Flexeril    Anxiety and depression PTSD Continue BuSpar , Prozac    ADHD - Hold Adderall   Chronic venous insufficiency SCDs  DVT prophylaxis: Lovenox     Code Status: Full Code Family Communication:   Status is: Inpatient Remains inpatient appropriate because: Ongoing management for acute hypoxia   PT Follow up Recs:    Subjective:  Feels slightly better compared to yesterday Still having exertional dyspnea when moving to commode and getting back  Examination:  General exam: Appears calm and comfortable  Respiratory system: Some bilateral rhonchiWith slight improvement compared to yesterday Cardiovascular system: Sinus tachycardia, no lower extremity edema noted Gastrointestinal system: Abdomen is nondistended, soft and nontender. No organomegaly or masses felt. Normal bowel sounds heard. Central nervous system: Alert and oriented. No focal neurological deficits. Extremities: Symmetric 5 x 5 power. Skin: No rashes, lesions or ulcers Psychiatry: Judgement and insight appear normal. Mood & affect appropriate.                Diet Orders (From admission, onward)     Start     Ordered   09/17/24 2150  Diet regular Room service appropriate? Yes; Fluid consistency: Thin  Diet effective now       Question Answer Comment  Room service appropriate? Yes   Fluid consistency: Thin      09/17/24 2149            Objective: Vitals:   09/20/24 2126 09/21/24 0509 09/21/24 1006 09/21/24 1039  BP:  (!) 157/104 (!) (P) 152/107   Pulse:  91 (P) 88   Resp:  15    Temp:  98.9 F (37.2 C) (P) 98 F (36.7 C)   TempSrc:  Oral (P) Oral   SpO2: 94% 96% (P) 94% 96%  Weight:      Height:       No intake or output data in the 24 hours ending 09/21/24 1058 Filed Weights   09/18/24 1122  Weight: 90.9 kg  Scheduled Meds:  atorvastatin   40 mg Oral Daily   budesonide  (PULMICORT ) nebulizer solution  0.5 mg Nebulization BID   busPIRone   15 mg Oral QID   enoxaparin  (LOVENOX ) injection  40 mg Subcutaneous Q24H   FLUoxetine   40 mg Oral BID   gabapentin   800 mg Oral TID   guaiFENesin   1,200 mg Oral BID   ipratropium-albuterol   3 mL Nebulization TID   levofloxacin   500 mg Oral q1800   methylPREDNISolone  (SOLU-MEDROL ) injection  80 mg Intravenous Daily   oxyCODONE   20 mg Oral Q1400   oxyCODONE    40 mg Oral BID   phentermine   37.5 mg Oral q AM   Continuous Infusions:  Nutritional status     Body mass index is 37.86 kg/m.  Data Reviewed:   CBC: Recent Labs  Lab 09/17/24 1650 09/18/24 0557 09/19/24 0359  WBC 20.1* 19.4* 20.5*  HGB 9.7* 8.7* 8.6*  HCT 29.4* 26.9* 26.5*  MCV 93.6 94.1 93.3  PLT 263 248 187   Basic Metabolic Panel: Recent Labs  Lab 09/17/24 1650 09/18/24 0557 09/19/24 0359 09/19/24 0804  NA 135 137  --  139  K 3.4* 4.3  --  4.0  CL 96* 100  --  101  CO2 24 25  --  26  GLUCOSE 132* 151*  --  100*  BUN 19 16  --  21*  CREATININE 1.56* 1.10*  --  0.94  CALCIUM  9.0 9.3  --  9.0  MG  --  2.1 2.4  --   PHOS  --   --  2.7  --    GFR: Estimated Creatinine Clearance: 71.8 mL/min (by C-G formula based on SCr of 0.94 mg/dL). Liver Function Tests: No results for input(s): AST, ALT, ALKPHOS, BILITOT, PROT, ALBUMIN  in the last 168 hours. No results for input(s): LIPASE, AMYLASE in the last 168 hours. No results for input(s): AMMONIA in the last 168 hours. Coagulation Profile: No results for input(s): INR, PROTIME in the last 168 hours. Cardiac Enzymes: No results for input(s): CKTOTAL, CKMB, CKMBINDEX, TROPONINI in the last 168 hours. BNP (last 3 results) Recent Labs    09/17/24 1650  PROBNP 934.0*   HbA1C: No results for input(s): HGBA1C in the last 72 hours. CBG: No results for input(s): GLUCAP in the last 168 hours. Lipid Profile: No results for input(s): CHOL, HDL, LDLCALC, TRIG, CHOLHDL, LDLDIRECT in the last 72 hours. Thyroid Function Tests: No results for input(s): TSH, T4TOTAL, FREET4, T3FREE, THYROIDAB in the last 72 hours. Anemia Panel: No results for input(s): VITAMINB12, FOLATE, FERRITIN, TIBC, IRON, RETICCTPCT in the last 72 hours. Sepsis Labs: Recent Labs  Lab 09/17/24 1854 09/18/24 0606 09/18/24 1004 09/18/24 1207  LATICACIDVEN 3.1* 2.9* 1.4 1.4     Recent Results (from the past 240 hours)  Resp panel by RT-PCR (RSV, Flu A&B, Covid) Anterior Nasal Swab     Status: None   Collection Time: 09/17/24  4:50 PM   Specimen: Anterior Nasal Swab  Result Value Ref Range Status   SARS Coronavirus 2 by RT PCR NEGATIVE NEGATIVE Final    Comment: (NOTE) SARS-CoV-2 target nucleic acids are NOT DETECTED.  The SARS-CoV-2 RNA is generally detectable in upper respiratory specimens during the acute phase of infection. The lowest concentration of SARS-CoV-2 viral copies this assay can detect is 138 copies/mL. A negative result does not preclude SARS-Cov-2 infection and should not be used as the sole basis for treatment or other patient management decisions. A negative result may occur  with  improper specimen collection/handling, submission of specimen other than nasopharyngeal swab, presence of viral mutation(s) within the areas targeted by this assay, and inadequate number of viral copies(<138 copies/mL). A negative result must be combined with clinical observations, patient history, and epidemiological information. The expected result is Negative.  Fact Sheet for Patients:  bloggercourse.com  Fact Sheet for Healthcare Providers:  seriousbroker.it  This test is no t yet approved or cleared by the [place]  FDA and  has been authorized for detection and/or diagnosis of SARS-CoV-2 by FDA under an Emergency Use Authorization (EUA). This EUA will remain  in effect (meaning this test can be used) for the duration of the COVID-19 declaration under Section 564(b)(1) of the Act, 21 U.S.C.section 360bbb-3(b)(1), unless the authorization is terminated  or revoked sooner.       Influenza A by PCR NEGATIVE NEGATIVE Final   Influenza B by PCR NEGATIVE NEGATIVE Final    Comment: (NOTE) The Xpert Xpress SARS-CoV-2/FLU/RSV plus assay is intended as an aid in the diagnosis of influenza from  Nasopharyngeal swab specimens and should not be used as a sole basis for treatment. Nasal washings and aspirates are unacceptable for Xpert Xpress SARS-CoV-2/FLU/RSV testing.  Fact Sheet for Patients: bloggercourse.com  Fact Sheet for Healthcare Providers: seriousbroker.it  This test is not yet approved or cleared by the [place]  FDA and has been authorized for detection and/or diagnosis of SARS-CoV-2 by FDA under an Emergency Use Authorization (EUA). This EUA will remain in effect (meaning this test can be used) for the duration of the COVID-19 declaration under Section 564(b)(1) of the Act, 21 U.S.C. section 360bbb-3(b)(1), unless the authorization is terminated or revoked.     Resp Syncytial Virus by PCR NEGATIVE NEGATIVE Final    Comment: (NOTE) Fact Sheet for Patients: bloggercourse.com  Fact Sheet for Healthcare Providers: seriousbroker.it  This test is not yet approved or cleared by the [place]  FDA and has been authorized for detection and/or diagnosis of SARS-CoV-2 by FDA under an Emergency Use Authorization (EUA). This EUA will remain in effect (meaning this test can be used) for the duration of the COVID-19 declaration under Section 564(b)(1) of the Act, 21 U.S.C. section 360bbb-3(b)(1), unless the authorization is terminated or revoked.  Performed at [facility], [address], [place], [place] [address]   Culture, blood (routine x 2)     Status: None (Preliminary result)   Collection Time: 09/17/24  7:00 PM   Specimen: BLOOD  Result Value Ref Range Status   Specimen Description   Final    BLOOD SITE NOT SPECIFIED Performed at [facility], [address], [place], [place] [address]    Special Requests   Final    BOTTLES DRAWN AEROBIC AND ANAEROBIC Blood Culture results may not be optimal due to an inadequate  volume of blood received in culture bottles Performed at [facility], [address], [place], [place] [address]    Culture   Final    NO GROWTH 4 DAYS Performed at [facility], [address], [place], [place] [address]    Report Status PENDING  Incomplete  Culture, blood (routine x 2)     Status: None (Preliminary result)   Collection Time: 09/18/24 12:07 PM   Specimen: BLOOD LEFT HAND  Result Value Ref Range Status   Specimen Description   Final    BLOOD LEFT HAND Performed at [facility], [address], [place], [place] [address]  Special Requests   Final    BOTTLES DRAWN AEROBIC AND ANAEROBIC Blood Culture results may not be optimal due to an inadequate volume of blood received in culture bottles Performed at [facility], [address], [place], [place] [address]    Culture   Final    NO GROWTH 3 DAYS Performed at [facility], [address], [place], [place] [address]    Report Status PENDING  Incomplete         Radiology Studies: No results found.         LOS: 4 days   Time spent= 35 mins    Burgess JAYSON Dare, MD [facility]  If 7PM-7AM, please contact night-coverage  09/21/2024, 10:58 AM  "

## 2024-09-21 NOTE — Plan of Care (Signed)
   Problem: Clinical Measurements: Goal: Will remain free from infection Outcome: Progressing   Problem: Clinical Measurements: Goal: Diagnostic test results will improve Outcome: Progressing   Problem: Clinical Measurements: Goal: Respiratory complications will improve Outcome: Progressing   Problem: Clinical Measurements: Goal: Cardiovascular complication will be avoided Outcome: Progressing

## 2024-09-22 DIAGNOSIS — J9601 Acute respiratory failure with hypoxia: Secondary | ICD-10-CM | POA: Diagnosis not present

## 2024-09-22 LAB — CULTURE, BLOOD (ROUTINE X 2): Culture: NO GROWTH

## 2024-09-22 NOTE — Progress Notes (Signed)
 " PROGRESS NOTE  Kelli Juarez  FMW:[id number] DOB: Jul 24, 1971 DOA: 09/17/2024 PCP: Celestia Harder, NP   Brief Narrative: 53 year old with history of COPD/asthma, chronic back pain, chronic venous stasis, DM 2, HTN, PTSD, ADHD, HLD, depression presenting to the ED from urgent care for hypoxia. Patient reported of worsening cough and shortness of breath over the past 3 days prior to admission. Upon admission chest x-ray showed findings consistent with bronchitis. Had slightly elevated proBNP. COVID/flu/RSV were negative. Started on antibiotics, steroids and bronchodilators. Had AKI on admission but this is now resolved. Leukocytosis persist secondary to steroid use. Overall  slow to improve , today on 3 L of nasal cannula.  Assessment & Plan:  Principal Problem:   Acute respiratory failure with hypoxia (HCC) Active Problems:   COPD exacerbation (HCC)   Community acquired pneumonia   AKI (acute kidney injury)   Chronic back pain   Anxiety and depression   Acute hypoxia secondary to community-acquired pneumonia and COPD exacerbation Severe sepsis secondary to community-acquired pneumonia/bronchitis -In the ER patient required 5 L of nasal cannula, not on any oxygen at home.  Started on steroids, bronchodilators, antibiotics.  CTA chest is negative for PE.    Advised nursing staff to continue to wean oxygen, remains on 3 L.  Getting intermittent IV Lasix . -Sepsis physiology slowly improving.  Lactic acidosis is resolved.  Leukocytosis secondary to steroid use.  She completed antibiotic course - I-S/flutter valve -Will continue to attempt to wean the oxygen today.   Acute kidney injury, resolved - Admission creatinine 1.56, baseline around 1.0.  - BMP stable    Hypertension - Will resume home medications once appropriate   HypoKalemia Replete as needed   Chronic back pain - Continue gabapentin , oxycodone  and as needed Flexeril    Anxiety and depression PTSD Continue BuSpar ,  Prozac    ADHD - Hold Adderall   Chronic venous insufficiency SCDs  Obesity: BMI 37.8         DVT prophylaxis:Place TED hose Start: 09/17/24 2354 enoxaparin  (LOVENOX ) injection 40 mg Start: 09/17/24 2200     Code Status: Full Code  Family Communication: None at the bedside  Patient status: Inpatient  Patient is from : Home  Anticipated discharge to: Home   Estimated DC date: 1-2 days   Consultants: None  Procedures: None  Antimicrobials:  Anti-infectives (From admission, onward)    Start     Dose/Rate Route Frequency Ordered Stop   09/20/24 1800  levofloxacin  (LEVAQUIN ) tablet 500 mg        500 mg Oral Daily-1800 09/20/24 0758 09/21/24 1716   09/18/24 1800  levofloxacin  (LEVAQUIN ) IVPB 750 mg  Status:  Discontinued        750 mg 100 mL/hr over 90 Minutes Intravenous Every 24 hours 09/17/24 2351 09/18/24 0230   09/18/24 1800  levofloxacin  (LEVAQUIN ) IVPB 500 mg  Status:  Discontinued        500 mg 100 mL/hr over 60 Minutes Intravenous Every 24 hours 09/18/24 0230 09/20/24 0758   09/18/24 0000  azithromycin  (ZITHROMAX ) 500 mg in sodium chloride  0.9 % 250 mL IVPB  Status:  Discontinued        500 mg 250 mL/hr over 60 Minutes Intravenous Every 24 hours 09/17/24 2351 09/17/24 2359   09/17/24 1815  levofloxacin  (LEVAQUIN ) IVPB 750 mg        750 mg 100 mL/hr over 90 Minutes Intravenous  Once 09/17/24 1802 09/17/24 2015       Subjective: Patient seen and examined at bedside  today.  Feels much better today.  She went to the bathroom without oxygen and does not feel short of breath.  Denies any worsening cough.  Lungs are almost clear to auscultation.  We discussed about monitoring her 1 more day to allow us  to wean the oxygen.  If she continues to require the oxygen, will do ambulatory oxygen test tomorrow  Objective: Vitals:   09/21/24 2117 09/21/24 2234 09/22/24 0531 09/22/24 0803  BP: (!) 143/98  (!) 141/80   Pulse: 88  77   Resp: 16  16   Temp: 98.2 F  (36.8 C)  97.8 F (36.6 C)   TempSrc: Oral  Oral   SpO2: 95% 96% 91% 92%  Weight:      Height:       No intake or output data in the 24 hours ending 09/22/24 0946 Filed Weights   09/18/24 1122  Weight: 90.9 kg    Examination:  General exam: Overall comfortable, not in distress, obese HEENT: PERRL Respiratory system:  no wheezes or crackles, mildly diminished sounds bilaterally Cardiovascular system: S1 & S2 heard, RRR.  Gastrointestinal system: Abdomen is nondistended, soft and nontender. Central nervous system: Alert and oriented Extremities: No edema, no clubbing ,no cyanosis Skin: No rashes, no ulcers,no icterus     Data Reviewed: I have personally reviewed following labs and imaging studies  CBC: Recent Labs  Lab 09/17/24 1650 09/18/24 0557 09/19/24 0359  WBC 20.1* 19.4* 20.5*  HGB 9.7* 8.7* 8.6*  HCT 29.4* 26.9* 26.5*  MCV 93.6 94.1 93.3  PLT 263 248 187   Basic Metabolic Panel: Recent Labs  Lab 09/17/24 1650 09/18/24 0557 09/19/24 0359 09/19/24 0804  NA 135 137  --  139  K 3.4* 4.3  --  4.0  CL 96* 100  --  101  CO2 24 25  --  26  GLUCOSE 132* 151*  --  100*  BUN 19 16  --  21*  CREATININE 1.56* 1.10*  --  0.94  CALCIUM  9.0 9.3  --  9.0  MG  --  2.1 2.4  --   PHOS  --   --  2.7  --      Recent Results (from the past 240 hours)  Resp panel by RT-PCR (RSV, Flu A&B, Covid) Anterior Nasal Swab     Status: None   Collection Time: 09/17/24  4:50 PM   Specimen: Anterior Nasal Swab  Result Value Ref Range Status   SARS Coronavirus 2 by RT PCR NEGATIVE NEGATIVE Final    Comment: (NOTE) SARS-CoV-2 target nucleic acids are NOT DETECTED.  The SARS-CoV-2 RNA is generally detectable in upper respiratory specimens during the acute phase of infection. The lowest concentration of SARS-CoV-2 viral copies this assay can detect is 138 copies/mL. A negative result does not preclude SARS-Cov-2 infection and should not be used as the sole basis for treatment  or other patient management decisions. A negative result may occur with  improper specimen collection/handling, submission of specimen other than nasopharyngeal swab, presence of viral mutation(s) within the areas targeted by this assay, and inadequate number of viral copies(<138 copies/mL). A negative result must be combined with clinical observations, patient history, and epidemiological information. The expected result is Negative.  Fact Sheet for Patients:  bloggercourse.com  Fact Sheet for Healthcare Providers:  seriousbroker.it  This test is no t yet approved or cleared by the [place]  FDA and  has been authorized for detection and/or diagnosis of SARS-CoV-2 by FDA under an  Emergency Use Authorization (EUA). This EUA will remain  in effect (meaning this test can be used) for the duration of the COVID-19 declaration under Section 564(b)(1) of the Act, 21 U.S.C.section 360bbb-3(b)(1), unless the authorization is terminated  or revoked sooner.       Influenza A by PCR NEGATIVE NEGATIVE Final   Influenza B by PCR NEGATIVE NEGATIVE Final    Comment: (NOTE) The Xpert Xpress SARS-CoV-2/FLU/RSV plus assay is intended as an aid in the diagnosis of influenza from Nasopharyngeal swab specimens and should not be used as a sole basis for treatment. Nasal washings and aspirates are unacceptable for Xpert Xpress SARS-CoV-2/FLU/RSV testing.  Fact Sheet for Patients: bloggercourse.com  Fact Sheet for Healthcare Providers: seriousbroker.it  This test is not yet approved or cleared by the [place]  FDA and has been authorized for detection and/or diagnosis of SARS-CoV-2 by FDA under an Emergency Use Authorization (EUA). This EUA will remain in effect (meaning this test can be used) for the duration of the COVID-19 declaration under Section 564(b)(1) of the Act, 21 U.S.C. section  360bbb-3(b)(1), unless the authorization is terminated or revoked.     Resp Syncytial Virus by PCR NEGATIVE NEGATIVE Final    Comment: (NOTE) Fact Sheet for Patients: bloggercourse.com  Fact Sheet for Healthcare Providers: seriousbroker.it  This test is not yet approved or cleared by the [place]  FDA and has been authorized for detection and/or diagnosis of SARS-CoV-2 by FDA under an Emergency Use Authorization (EUA). This EUA will remain in effect (meaning this test can be used) for the duration of the COVID-19 declaration under Section 564(b)(1) of the Act, 21 U.S.C. section 360bbb-3(b)(1), unless the authorization is terminated or revoked.  Performed at [facility], [address] [place], [place] [address]   Culture, blood (routine x 2)     Status: None   Collection Time: 09/17/24  7:00 PM   Specimen: BLOOD  Result Value Ref Range Status   Specimen Description   Final    BLOOD SITE NOT SPECIFIED Performed at [facility], [address] [place], [place] [address]    Special Requests   Final    BOTTLES DRAWN AEROBIC AND ANAEROBIC Blood Culture results may not be optimal due to an inadequate volume of blood received in culture bottles Performed at [facility], [address] [place], [place] [address]    Culture   Final    NO GROWTH 5 DAYS Performed at [facility], [address], [place], [place] [address]    Report Status 09/22/2024 FINAL  Final  Culture, blood (routine x 2)     Status: None (Preliminary result)   Collection Time: 09/18/24 12:07 PM   Specimen: BLOOD LEFT HAND  Result Value Ref Range Status   Specimen Description   Final    BLOOD LEFT HAND Performed at [facility], [address], [place], [place] [address]    Special Requests   Final    BOTTLES DRAWN AEROBIC AND ANAEROBIC Blood Culture results may not be  optimal due to an inadequate volume of blood received in culture bottles Performed at [facility], [address], [place], [place] [address]    Culture   Final    NO GROWTH 4 DAYS Performed at [facility], [address] [place], [place] [address]    Report Status PENDING  Incomplete     Radiology Studies: No results found.  Scheduled Meds:  atorvastatin   40 mg Oral Daily   budesonide  (PULMICORT ) nebulizer solution  0.5 mg Nebulization BID   busPIRone   15 mg Oral QID   enoxaparin  (LOVENOX ) injection  40 mg Subcutaneous Q24H   FLUoxetine   40 mg Oral BID   gabapentin   800 mg Oral TID   guaiFENesin   1,200 mg Oral BID   ipratropium-albuterol   3 mL Nebulization TID   methylPREDNISolone  (SOLU-MEDROL ) injection  80 mg Intravenous Daily   oxyCODONE   20 mg Oral Q1400   oxyCODONE   40 mg Oral BID   phentermine   37.5 mg Oral q AM   Continuous Infusions:   LOS: 5 days   Ivonne Mustache, MD [facility] P12/28/2025, 9:46 AM  "

## 2024-09-22 NOTE — Plan of Care (Signed)
 Patient reports that she feels close to her baseline and better than she did when first arrived  Problem: Education: Goal: Knowledge of General Education information will improve Description: Including pain rating scale, medication(s)/side effects and non-pharmacologic comfort measures Outcome: Progressing   Problem: Clinical Measurements: Goal: Ability to maintain clinical measurements within normal limits will improve Outcome: Progressing   Problem: Health Behavior/Discharge Planning: Goal: Ability to manage health-related needs will improve Outcome: Adequate for Discharge   Problem: Clinical Measurements: Goal: Will remain free from infection Outcome: Adequate for Discharge   Problem: Safety: Goal: Ability to remain free from injury will improve Outcome: Adequate for Discharge

## 2024-09-23 ENCOUNTER — Other Ambulatory Visit (HOSPITAL_COMMUNITY): Payer: Self-pay

## 2024-09-23 DIAGNOSIS — J9601 Acute respiratory failure with hypoxia: Secondary | ICD-10-CM | POA: Diagnosis not present

## 2024-09-23 LAB — CBC
HCT: 28.3 % — ABNORMAL LOW (ref 36.0–46.0)
Hemoglobin: 9.2 g/dL — ABNORMAL LOW (ref 12.0–15.0)
MCH: 29.6 pg (ref 26.0–34.0)
MCHC: 32.5 g/dL (ref 30.0–36.0)
MCV: 91 fL (ref 80.0–100.0)
Platelets: 399 K/uL (ref 150–400)
RBC: 3.11 MIL/uL — ABNORMAL LOW (ref 3.87–5.11)
RDW: 15.4 % (ref 11.5–15.5)
WBC: 14.6 K/uL — ABNORMAL HIGH (ref 4.0–10.5)
nRBC: 0.1 % (ref 0.0–0.2)

## 2024-09-23 LAB — CULTURE, BLOOD (ROUTINE X 2): Culture: NO GROWTH

## 2024-09-23 MED ORDER — PREDNISONE 20 MG PO TABS
40.0000 mg | ORAL_TABLET | Freq: Every day | ORAL | 0 refills | Status: AC
  Filled 2024-09-23: qty 10, 5d supply, fill #0

## 2024-09-23 MED ORDER — ALBUTEROL SULFATE HFA 108 (90 BASE) MCG/ACT IN AERS
2.0000 | INHALATION_SPRAY | RESPIRATORY_TRACT | 0 refills | Status: AC | PRN
  Filled 2024-09-23: qty 6.7, 25d supply, fill #0

## 2024-09-23 MED ORDER — GUAIFENESIN ER 600 MG PO TB12
600.0000 mg | ORAL_TABLET | Freq: Two times a day (BID) | ORAL | 0 refills | Status: AC
  Filled 2024-09-23: qty 10, 5d supply, fill #0

## 2024-09-23 NOTE — Progress Notes (Addendum)
 Discharge instructions reviewed with patient and spouse, verbalized understanding. All questions answered. All belongings accounted for. Patient to follow up with MD in  1 weeks. PIV removed.   Waiting for home o2 DME.   Discharge Medications delivered from TOC meds to bed [facility] outpatient pharmacy by this RN.

## 2024-09-23 NOTE — Discharge Summary (Addendum)
 Physician Discharge Summary  AMARIZ FLAMENCO FMW:[id number] DOB: May 06, 1971 DOA: 09/17/2024  PCP: Celestia Harder, NP  Admit date: 09/17/2024 Discharge date: 09/23/2024  Admitted From: Home Disposition:  Home  Discharge Condition:Stable CODE STATUS:FULL Diet recommendation: Heart Healthy   Brief/Interim Summary: 53 year old with history of COPD/asthma, chronic back pain, chronic venous stasis, DM 2, HTN, PTSD, ADHD, HLD, depression presenting to the ED from urgent care for hypoxia. Patient reported of worsening cough and shortness of breath over the past 3 days prior to admission. Upon admission chest x-ray showed findings consistent with bronchitis. Had slightly elevated proBNP. COVID/flu/RSV were negative. Started on antibiotics, steroids and bronchodilators. Had AKI on admission but this is now resolved.  Now improved clinically and currently on room air at least since last 48 hours .  She needed oxygen supplementation for ambulation so qualified for 2 L of oxygen at home.  Feels ready to go home.  Medically stable for discharge.  Following problems were addressed during the hospitalization:  Acute hypoxia secondary to community-acquired pneumonia and COPD exacerbation Severe sepsis secondary to community-acquired pneumonia/bronchitis -In the ER patient required 5 L of nasal cannula, not on any oxygen at home.  Started on steroids, bronchodilators, antibiotics.  CTA chest is negative for PE.     -Sepsis physiology resolved.  Lactic acidosis is resolved.  Leukocytosis secondary to steroid use.  She completed antibiotic course Has been weaned to room air at rest but needs oxygen for ambulation.  Qualified  for 2 L of oxygen for home   Acute kidney injury, resolved - Admission creatinine 1.56, baseline around 1.0.  - Resolved    Hypertension - Home medication to be resumed on  discharge   HypoKalemia Corrected   Chronic back pain - Continue gabapentin , oxycodone  and as needed  Flexeril    Anxiety and depression PTSD Continue BuSpar , Prozac    ADHD - On Adderall   Chronic venous insufficiency Stable  Tobacco use Counseled for cessation   Obesity: BMI 37.8       Discharge Diagnoses:  Principal Problem:   Acute respiratory failure with hypoxia (HCC) Active Problems:   COPD exacerbation (HCC)   Community acquired pneumonia   AKI (acute kidney injury)   Chronic back pain   Anxiety and depression    Discharge Instructions  Discharge Instructions     Discharge instructions   Complete by: As directed    1)Please take your medications as instructed 2)Follow up with your PCP in a week 3)We have submitted a referral to pulmonology for outpatient follow-up.  You might get a call for appointment 4)Quit smoking   Increase activity slowly   Complete by: As directed    Pulmonary Visit   Complete by: As directed    COPD   Reason for referral: Other Pulmonary      Allergies as of 09/23/2024       Reactions   Ketorolac Other (See Comments)   Altered mental status, gets severely violent, per patient Anger and agitation, also   Penicillins Anaphylaxis, Swelling, Other (See Comments)   Reports throat closes up        Medication List     TAKE these medications    albuterol  (2.5 MG/3ML) 0.083% nebulizer solution Commonly known as: PROVENTIL  Take 3 mLs (2.5 mg total) by nebulization every 6 (six) hours as needed for wheezing or shortness of breath. What changed: Another medication with the same name was changed. Make sure you understand how and when to take each.   albuterol  108 (  90 Base) MCG/ACT inhaler Commonly known as: VENTOLIN  HFA Inhale 2 puffs into the lungs every 3 (three) hours as needed. What changed: reasons to take this   amphetamine -dextroamphetamine  15 MG tablet Commonly known as: ADDERALL Take 15 mg by mouth daily.   Anoro Ellipta  62.5-25 MCG/ACT Aepb Generic drug: umeclidinium-vilanterol Inhale 1 puff into the  lungs daily.   atorvastatin  40 MG tablet Commonly known as: LIPITOR Take 40 mg by mouth daily.   busPIRone  15 MG tablet Commonly known as: BUSPAR  Take 15 mg by mouth in the morning, at noon, in the evening, and at bedtime.   cyclobenzaprine  10 MG tablet Commonly known as: FLEXERIL  Take 1 tablet (10 mg total) by mouth 3 (three) times daily as needed for muscle spasms. What changed: when to take this   diazepam  2 MG tablet Commonly known as: VALIUM  Take 2 mg by mouth every 8 (eight) hours as needed for anxiety.   FLUoxetine  40 MG capsule Commonly known as: PROZAC  Take 40 mg by mouth 2 (two) times daily.   furosemide  20 MG tablet Commonly known as: LASIX  Take 20 mg by mouth in the morning.   gabapentin  800 MG tablet Commonly known as: NEURONTIN  Take 800 mg by mouth in the morning, at noon, and at bedtime.   guaiFENesin  600 MG 12 hr tablet Commonly known as: MUCINEX  Take 1 tablet (600 mg total) by mouth 2 (two) times daily for 5 days.   lisinopril  40 MG tablet Commonly known as: ZESTRIL  Take 40 mg by mouth daily.   metFORMIN  500 MG tablet Commonly known as: GLUCOPHAGE  Take 500 mg by mouth daily with breakfast.   NICODERM CQ TD Place 1 patch onto the skin daily as needed (for smoking cessation).   Oxycodone  HCl 20 MG Tabs Take 20-40 mg by mouth See admin instructions. Take 40 mg by mouth in the morning, 20 mg at 2 PM, and 40 mg at bedtime   phentermine  37.5 MG capsule Take 37.5 mg by mouth in the morning.   predniSONE  20 MG tablet Commonly known as: DELTASONE  Take 2 tablets (40 mg total) by mouth daily with breakfast for 5 days.               Durable Medical Equipment  (From admission, onward)           Start     Ordered   09/23/24 1105  For home use only DME oxygen  Once       Question Answer Comment  Length of Need Lifetime   Mode or (Route) Nasal cannula   Liters per Minute 2   Frequency Continuous (stationary and portable oxygen unit needed)    Oxygen delivery system: Gas      09/23/24 1104            Follow-up Information     Celestia Harder, NP. Schedule an appointment as soon as possible for a visit in 1 week(s).   Specialty: Nurse Practitioner Contact information: [address] [place] [address] [phone]                Allergies[1]  Consultations: None   Procedures/Studies: CT Angio Chest Pulmonary Embolism (PE) W or WO Contrast Result Date: 09/18/2024 CLINICAL DATA:  Shortness of breath EXAM: CT ANGIOGRAPHY CHEST WITH CONTRAST TECHNIQUE: Multidetector CT imaging of the chest was performed using the standard protocol during bolus administration of intravenous contrast. Multiplanar CT image reconstructions and MIPs were obtained to evaluate the vascular anatomy. RADIATION DOSE  REDUCTION: This exam was performed according to the departmental dose-optimization program which includes automated exposure control, adjustment of the mA and/or kV according to patient size and/or use of iterative reconstruction technique. CONTRAST:  75mL OMNIPAQUE  IOHEXOL  350 MG/ML SOLN COMPARISON:  March 18, 2024 FINDINGS: Cardiovascular: Satisfactory opacification of the pulmonary arteries to the segmental level. No evidence of pulmonary embolism. Normal heart size. No pericardial effusion. Mediastinum/Nodes: No enlarged mediastinal, hilar, or axillary lymph nodes. Thyroid gland, trachea, and esophagus demonstrate no significant findings. Lungs/Pleura: No pneumothorax or pleural effusion is noted. Diffuse bilateral lung opacities are noted in the upper and lower lobes bilaterally with some peripheral sparing, concerning for edema or multifocal pneumonia. Upper Abdomen: No acute abnormality. Musculoskeletal: No chest wall abnormality. No acute or significant osseous findings. Review of the MIP images confirms the above findings. IMPRESSION: 1. No definite evidence of pulmonary embolus. 2. Diffuse bilateral lung  opacities are noted with some peripheral sparing, concerning for edema or multifocal pneumonia. Electronically Signed   By: Lynwood Landy Raddle M.D.   On: 09/18/2024 10:33   DG Chest Port 1 View Result Date: 09/17/2024 CLINICAL DATA:  Cough and shortness of breath. EXAM: PORTABLE CHEST 1 VIEW COMPARISON:  03/18/2024 FINDINGS: Lung volumes are low. The heart is borderline enlarged. Moderate diffuse peribronchial thickening. No confluent airspace disease. No pneumothorax or large pleural effusion. On limited assessment, no acute osseous findings. IMPRESSION: Moderate diffuse peribronchial thickening, can be seen with bronchitis or asthma. Electronically Signed   By: Andrea Gasman M.D.   On: 09/17/2024 18:53      Subjective: Patient seen and examined at bedside today.  Hemodynamically stable.  On room air.  Denies shortness of breath or cough.  Very eager to go home.  Medically stable for discharge.  Discharge Exam: Vitals:   09/23/24 0738 09/23/24 0836  BP:  (!) 140/92  Pulse:  95  Resp:    Temp:  98.4 F (36.9 C)  SpO2: 94% 93%   Vitals:   09/22/24 2044 09/23/24 0446 09/23/24 0738 09/23/24 0836  BP: (!) 154/103 (!) 140/91  (!) 140/92  Pulse: 87 80  95  Resp: 18 16    Temp: 98.2 F (36.8 C) 98 F (36.7 C)  98.4 F (36.9 C)  TempSrc: Oral Oral  Oral  SpO2: 91% 91% 94% 93%  Weight:      Height:        General: Pt is alert, awake, not in acute distress Cardiovascular: RRR, S1/S2 +, no rubs, no gallops Respiratory: CTA bilaterally, no wheezing, no rhonchi Abdominal: Soft, NT, ND, bowel sounds + Extremities: no edema, no cyanosis    The results of significant diagnostics from this hospitalization (including imaging, microbiology, ancillary and laboratory) are listed below for reference.     Microbiology: Recent Results (from the past 240 hours)  Resp panel by RT-PCR (RSV, Flu A&B, Covid) Anterior Nasal Swab     Status: None   Collection Time: 09/17/24  4:50 PM   Specimen:  Anterior Nasal Swab  Result Value Ref Range Status   SARS Coronavirus 2 by RT PCR NEGATIVE NEGATIVE Final    Comment: (NOTE) SARS-CoV-2 target nucleic acids are NOT DETECTED.  The SARS-CoV-2 RNA is generally detectable in upper respiratory specimens during the acute phase of infection. The lowest concentration of SARS-CoV-2 viral copies this assay can detect is 138 copies/mL. A negative result does not preclude SARS-Cov-2 infection and should not be used as the sole basis for treatment or other patient management  decisions. A negative result may occur with  improper specimen collection/handling, submission of specimen other than nasopharyngeal swab, presence of viral mutation(s) within the areas targeted by this assay, and inadequate number of viral copies(<138 copies/mL). A negative result must be combined with clinical observations, patient history, and epidemiological information. The expected result is Negative.  Fact Sheet for Patients:  bloggercourse.com  Fact Sheet for Healthcare Providers:  seriousbroker.it  This test is no t yet approved or cleared by the [place]  FDA and  has been authorized for detection and/or diagnosis of SARS-CoV-2 by FDA under an Emergency Use Authorization (EUA). This EUA will remain  in effect (meaning this test can be used) for the duration of the COVID-19 declaration under Section 564(b)(1) of the Act, 21 U.S.C.section 360bbb-3(b)(1), unless the authorization is terminated  or revoked sooner.       Influenza A by PCR NEGATIVE NEGATIVE Final   Influenza B by PCR NEGATIVE NEGATIVE Final    Comment: (NOTE) The Xpert Xpress SARS-CoV-2/FLU/RSV plus assay is intended as an aid in the diagnosis of influenza from Nasopharyngeal swab specimens and should not be used as a sole basis for treatment. Nasal washings and aspirates are unacceptable for Xpert Xpress SARS-CoV-2/FLU/RSV testing.  Fact  Sheet for Patients: bloggercourse.com  Fact Sheet for Healthcare Providers: seriousbroker.it  This test is not yet approved or cleared by the [place]  FDA and has been authorized for detection and/or diagnosis of SARS-CoV-2 by FDA under an Emergency Use Authorization (EUA). This EUA will remain in effect (meaning this test can be used) for the duration of the COVID-19 declaration under Section 564(b)(1) of the Act, 21 U.S.C. section 360bbb-3(b)(1), unless the authorization is terminated or revoked.     Resp Syncytial Virus by PCR NEGATIVE NEGATIVE Final    Comment: (NOTE) Fact Sheet for Patients: bloggercourse.com  Fact Sheet for Healthcare Providers: seriousbroker.it  This test is not yet approved or cleared by the [place]  FDA and has been authorized for detection and/or diagnosis of SARS-CoV-2 by FDA under an Emergency Use Authorization (EUA). This EUA will remain in effect (meaning this test can be used) for the duration of the COVID-19 declaration under Section 564(b)(1) of the Act, 21 U.S.C. section 360bbb-3(b)(1), unless the authorization is terminated or revoked.  Performed at [facility], [address] [place], [place] [address]   Culture, blood (routine x 2)     Status: None   Collection Time: 09/17/24  7:00 PM   Specimen: BLOOD  Result Value Ref Range Status   Specimen Description   Final    BLOOD SITE NOT SPECIFIED Performed at [facility], [address], [place], [place] [address]    Special Requests   Final    BOTTLES DRAWN AEROBIC AND ANAEROBIC Blood Culture results may not be optimal due to an inadequate volume of blood received in culture bottles Performed at [facility], [address], [place], [place] [address]    Culture   Final    NO GROWTH 5 DAYS Performed at [facility], [address] [place], [place] [address]    Report Status 09/22/2024 FINAL  Final  Culture, blood (routine x 2)     Status: None   Collection Time: 09/18/24 12:07 PM   Specimen: BLOOD LEFT HAND  Result Value Ref Range Status   Specimen Description   Final    BLOOD LEFT HAND Performed at [facility], [address]  [place], [place] [address]    Special Requests   Final    BOTTLES DRAWN AEROBIC AND ANAEROBIC Blood Culture results may not be optimal due to an inadequate volume of blood received in culture bottles Performed at [facility], [address], [place], [place] [address]    Culture   Final    NO GROWTH 5 DAYS Performed at [facility], [address], [place], [place] [address]    Report Status 09/23/2024 FINAL  Final     Labs: BNP (last 3 results) Recent Labs    03/11/24 2130  BNP 40.9   Basic Metabolic Panel: Recent Labs  Lab 09/17/24 1650 09/18/24 0557 09/19/24 0359 09/19/24 0804  NA 135 137  --  139  K 3.4* 4.3  --  4.0  CL 96* 100  --  101  CO2 24 25  --  26  GLUCOSE 132* 151*  --  100*  BUN 19 16  --  21*  CREATININE 1.56* 1.10*  --  0.94  CALCIUM  9.0 9.3  --  9.0  MG  --  2.1 2.4  --   PHOS  --   --  2.7  --    Liver Function Tests: No results for input(s): AST, ALT, ALKPHOS, BILITOT, PROT, ALBUMIN  in the last 168 hours. No results for input(s): LIPASE, AMYLASE in the last 168 hours. No results for input(s): AMMONIA in the last 168 hours. CBC: Recent Labs  Lab 09/17/24 1650 09/18/24 0557 09/19/24 0359 09/23/24 0343  WBC 20.1* 19.4* 20.5* 14.6*  HGB 9.7* 8.7* 8.6* 9.2*  HCT 29.4* 26.9* 26.5* 28.3*  MCV 93.6 94.1 93.3 91.0  PLT 263 248 187 399   Cardiac Enzymes: No results for input(s): CKTOTAL, CKMB, CKMBINDEX, TROPONINI in the last 168 hours. BNP: Invalid input(s): POCBNP CBG: No results for input(s): GLUCAP in the last 168 hours. D-Dimer No results for  input(s): DDIMER in the last 72 hours. Hgb A1c No results for input(s): HGBA1C in the last 72 hours. Lipid Profile No results for input(s): CHOL, HDL, LDLCALC, TRIG, CHOLHDL, LDLDIRECT in the last 72 hours. Thyroid function studies No results for input(s): TSH, T4TOTAL, T3FREE, THYROIDAB in the last 72 hours.  Invalid input(s): FREET3 Anemia work up No results for input(s): VITAMINB12, FOLATE, FERRITIN, TIBC, IRON, RETICCTPCT in the last 72 hours. Urinalysis    Component Value Date/Time   COLORURINE YELLOW 09/25/2023 1155   APPEARANCEUR CLEAR 09/25/2023 1155   LABSPEC 1.005 09/25/2023 1155   PHURINE 7.0 09/25/2023 1155   GLUCOSEU NEGATIVE 09/25/2023 1155   HGBUR SMALL (A) 09/25/2023 1155   BILIRUBINUR NEGATIVE 09/25/2023 1155   BILIRUBINUR negative 11/08/2020 0947   KETONESUR NEGATIVE 09/25/2023 1155   PROTEINUR NEGATIVE 09/25/2023 1155   UROBILINOGEN 0.2 11/08/2020 0947   NITRITE NEGATIVE 09/25/2023 1155   LEUKOCYTESUR NEGATIVE 09/25/2023 1155   Sepsis Labs Recent Labs  Lab 09/17/24 1650 09/18/24 0557 09/19/24 0359 09/23/24 0343  WBC 20.1* 19.4* 20.5* 14.6*   Microbiology Recent Results (from the past 240 hours)  Resp panel by RT-PCR (RSV, Flu A&B, Covid) Anterior Nasal Swab     Status: None   Collection Time: 09/17/24  4:50 PM   Specimen: Anterior Nasal Swab  Result Value Ref Range Status   SARS Coronavirus 2 by RT PCR NEGATIVE NEGATIVE Final    Comment: (NOTE) SARS-CoV-2 target nucleic acids are NOT DETECTED.  The SARS-CoV-2 RNA is generally detectable in upper respiratory specimens during the acute phase of infection. The  lowest concentration of SARS-CoV-2 viral copies this assay can detect is 138 copies/mL. A negative result does not preclude SARS-Cov-2 infection and should not be used as the sole basis for treatment or other patient management decisions. A negative result may occur with  improper specimen  collection/handling, submission of specimen other than nasopharyngeal swab, presence of viral mutation(s) within the areas targeted by this assay, and inadequate number of viral copies(<138 copies/mL). A negative result must be combined with clinical observations, patient history, and epidemiological information. The expected result is Negative.  Fact Sheet for Patients:  bloggercourse.com  Fact Sheet for Healthcare Providers:  seriousbroker.it  This test is no t yet approved or cleared by the [place]  FDA and  has been authorized for detection and/or diagnosis of SARS-CoV-2 by FDA under an Emergency Use Authorization (EUA). This EUA will remain  in effect (meaning this test can be used) for the duration of the COVID-19 declaration under Section 564(b)(1) of the Act, 21 U.S.C.section 360bbb-3(b)(1), unless the authorization is terminated  or revoked sooner.       Influenza A by PCR NEGATIVE NEGATIVE Final   Influenza B by PCR NEGATIVE NEGATIVE Final    Comment: (NOTE) The Xpert Xpress SARS-CoV-2/FLU/RSV plus assay is intended as an aid in the diagnosis of influenza from Nasopharyngeal swab specimens and should not be used as a sole basis for treatment. Nasal washings and aspirates are unacceptable for Xpert Xpress SARS-CoV-2/FLU/RSV testing.  Fact Sheet for Patients: bloggercourse.com  Fact Sheet for Healthcare Providers: seriousbroker.it  This test is not yet approved or cleared by the [place]  FDA and has been authorized for detection and/or diagnosis of SARS-CoV-2 by FDA under an Emergency Use Authorization (EUA). This EUA will remain in effect (meaning this test can be used) for the duration of the COVID-19 declaration under Section 564(b)(1) of the Act, 21 U.S.C. section 360bbb-3(b)(1), unless the authorization is terminated or revoked.     Resp Syncytial  Virus by PCR NEGATIVE NEGATIVE Final    Comment: (NOTE) Fact Sheet for Patients: bloggercourse.com  Fact Sheet for Healthcare Providers: seriousbroker.it  This test is not yet approved or cleared by the [place]  FDA and has been authorized for detection and/or diagnosis of SARS-CoV-2 by FDA under an Emergency Use Authorization (EUA). This EUA will remain in effect (meaning this test can be used) for the duration of the COVID-19 declaration under Section 564(b)(1) of the Act, 21 U.S.C. section 360bbb-3(b)(1), unless the authorization is terminated or revoked.  Performed at [facility], [address] [place], [place] [address]   Culture, blood (routine x 2)     Status: None   Collection Time: 09/17/24  7:00 PM   Specimen: BLOOD  Result Value Ref Range Status   Specimen Description   Final    BLOOD SITE NOT SPECIFIED Performed at [facility], [address] [place], [place] [address]    Special Requests   Final    BOTTLES DRAWN AEROBIC AND ANAEROBIC Blood Culture results may not be optimal due to an inadequate volume of blood received in culture bottles Performed at [facility], [address], [place], [place] [address]    Culture   Final    NO GROWTH 5 DAYS Performed at [facility], [address], [place], [place] [address]    Report Status 09/22/2024 FINAL  Final  Culture, blood (routine x 2)     Status: None   Collection Time: 09/18/24 12:07  PM   Specimen: BLOOD LEFT HAND  Result Value Ref Range Status   Specimen Description   Final    BLOOD LEFT HAND Performed at [facility], [address], [place], [place] [address]    Special Requests   Final    BOTTLES DRAWN AEROBIC AND ANAEROBIC Blood Culture results may not be optimal due to an inadequate volume of blood received in culture bottles Performed at [facility], [address], [place], [place] [address]    Culture   Final    NO GROWTH 5 DAYS Performed at [facility], [address] [place], [place] [address]    Report Status 09/23/2024 FINAL  Final    Please note: You were cared for by a hospitalist during your hospital stay. Once you are discharged, your primary care physician will handle any further medical issues. Please note that NO REFILLS for any discharge medications will be authorized once you are discharged, as it is imperative that you return to your primary care physician (or establish a relationship with a primary care physician if you do not have one) for your post hospital discharge needs so that they can reassess your need for medications and monitor your lab values.    Time coordinating discharge: 40 minutes  SIGNED:   Ivonne Mustache, MD  [facility] 09/23/2024, 11:05 AM Pager [phone]  If 7PM-7AM, please contact night-coverage [email or web address] Password [email or web address]     [1]  Allergies Allergen Reactions   Ketorolac Other (See Comments)    Altered mental status, gets severely violent, per patient Anger and agitation, also   Penicillins Anaphylaxis, Swelling and Other (See Comments)    Reports throat closes up

## 2024-09-23 NOTE — Progress Notes (Addendum)
 SATURATION QUALIFICATIONS: (This note is used to comply with regulatory documentation for home oxygen)  Patient Saturations on Room Air at Rest = 90%  Patient Saturations on Room Air while Ambulating = 87%  Patient Saturations on 2 Liters of oxygen while Ambulating = 97%  Please briefly explain why patient needs home oxygen:  Oxygen supplementation is required to maintain saturation level greater than 90%.

## 2024-09-23 NOTE — Plan of Care (Signed)

## 2024-09-23 NOTE — TOC Transition Note (Signed)
 Transition of Care [facility]) - Discharge Note   Patient Details  Name: Kelli Juarez MRN: [id number] Date of Birth: 15-Jul-1971  Transition of [facility]) CM/SW Contact:  Tawni CHRISTELLA Eva, LCSW Phone Number: 09/23/2024, 11:24 AM   Clinical Narrative:     Pt rec for home O2, referral made with [employer]. Oxygen will be delivered to pt's room prior to discharge. Pt to d/c home no further ICM needs, ICM sign off.  Final next level of care: Home/Self Care Barriers to Discharge: Barriers Resolved   Patient Goals and CMS Choice Patient states their goals for this hospitalization and ongoing recovery are:: retrun home          Discharge Placement                    Patient and family notified of of transfer: 09/23/24  Discharge Plan and Services Additional resources added to the After Visit Summary for                      Date DME Agency Contacted: 09/23/24 Time DME Agency Contacted: [id number] Representative spoke with at DME Agency: jermaine            Social Drivers of Health (SDOH) Interventions SDOH Screenings   Food Insecurity: No Food Insecurity (09/18/2024)  Housing: Low Risk (09/18/2024)  Recent Concern: Housing - High Risk (06/25/2024)   Received from [facility]  Transportation Needs: No Transportation Needs (09/18/2024)  Utilities: Not At Risk (09/18/2024)  Financial Resource Strain: Medium Risk (06/25/2024)   Received from [facility]  Physical Activity: Inactive (06/25/2024)   Received from [facility]  Social Connections: Patient Declined (06/25/2024)   Received from [facility]  Stress: No Stress Concern Present (06/25/2024)   Received from [facility]  Tobacco Use: High Risk (09/17/2024)     Readmission Risk Interventions     No data to display

## 2024-09-23 NOTE — Plan of Care (Signed)
" °  Problem: Clinical Measurements: Goal: Will remain free from infection Outcome: Adequate for Discharge   Problem: Clinical Measurements: Goal: Diagnostic test results will improve Outcome: Adequate for Discharge   Problem: Clinical Measurements: Goal: Cardiovascular complication will be avoided Outcome: Adequate for Discharge   Problem: Activity: Goal: Risk for activity intolerance will decrease Outcome: Adequate for Discharge   Problem: Elimination: Goal: Will not experience complications related to bowel motility Outcome: Adequate for Discharge   Problem: Elimination: Goal: Will not experience complications related to urinary retention Outcome: Adequate for Discharge   "

## 2024-10-10 ENCOUNTER — Ambulatory Visit

## 2024-10-25 ENCOUNTER — Telehealth: Payer: Self-pay

## 2024-10-25 ENCOUNTER — Ambulatory Visit: Admitting: Student

## 2024-10-25 NOTE — Telephone Encounter (Signed)
 Pt called and lvmail stating that soeone needed to call her, she did not state why. I called pt back and pt vmail is full and unable to leave a message

## 2024-10-30 ENCOUNTER — Ambulatory Visit
Admission: EM | Admit: 2024-10-30 | Discharge: 2024-10-30 | Disposition: A | Discharge status: Home / Self Care | Source: Home / Self Care

## 2024-10-30 ENCOUNTER — Other Ambulatory Visit: Payer: Self-pay

## 2024-10-30 ENCOUNTER — Encounter: Payer: Self-pay | Admitting: Emergency Medicine

## 2024-10-30 DIAGNOSIS — R600 Localized edema: Secondary | ICD-10-CM
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
## 2024-10-30 NOTE — Discharge Instructions (Addendum)
" °  1. Bilateral lower extremity edema (Primary) - ED EKG completed today in UC shows normal sinus rhythm with a ventricular rate of 93 bpm, no STEMI. - AMB referral to wound care center for evaluation and management of lower extremity peripheral edema. - Recommend follow-up with primary care provider to discuss increasing dosage of Lasix  as well as possible need for laboratory testing to evaluate for other potential causes. - There is very minimal concern for DVT as the cause of swelling due to symptoms being bilateral.  Typically blood clots only manifest in 1 extremity at a time.  -Continue to monitor symptoms for any change in severity if there is any escalation of current symptoms or development of new symptoms follow-up in ER for further evaluation and management. "

## 2024-10-30 NOTE — ED Triage Notes (Signed)
 Patient presents with bilateral leg pain, and swelling going pass the knee x 5 days.  Patient has been soaking her legs and is taking pain medication.

## 2024-10-31 ENCOUNTER — Telehealth: Payer: Self-pay

## 2024-10-31 NOTE — Telephone Encounter (Signed)
 Received patient's surgical clearance form clearing her for surgery. Scanned in chart.

## 2024-11-28 ENCOUNTER — Encounter: Admitting: Student

## 2024-12-23 ENCOUNTER — Ambulatory Visit (HOSPITAL_BASED_OUTPATIENT_CLINIC_OR_DEPARTMENT_OTHER): Admit: 2024-12-23

## 2024-12-23 ENCOUNTER — Encounter (HOSPITAL_BASED_OUTPATIENT_CLINIC_OR_DEPARTMENT_OTHER): Payer: Self-pay

## 2025-01-01 ENCOUNTER — Encounter

## 2025-01-09 ENCOUNTER — Ambulatory Visit

## 2025-01-17 ENCOUNTER — Encounter: Admitting: Student

## 2025-03-19 ENCOUNTER — Encounter
